# Patient Record
Sex: Male | Born: 1965 | Race: White | Hispanic: No | Marital: Married | Smoking: Never smoker
Health system: Southern US, Community
[De-identification: ages and names within clinical notes are randomized; demographics above are authoritative.]

## PROBLEM LIST (undated history)

## (undated) DIAGNOSIS — E7800 Pure hypercholesterolemia, unspecified: Secondary | ICD-10-CM

## (undated) DIAGNOSIS — K219 Gastro-esophageal reflux disease without esophagitis: Secondary | ICD-10-CM

## (undated) DIAGNOSIS — I10 Essential (primary) hypertension: Secondary | ICD-10-CM

## (undated) DIAGNOSIS — E119 Type 2 diabetes mellitus without complications: Secondary | ICD-10-CM

---

## 2005-05-07 ENCOUNTER — Emergency Department: Payer: Self-pay | Admitting: Emergency Medicine

## 2005-08-26 ENCOUNTER — Ambulatory Visit: Payer: Self-pay | Admitting: Unknown Physician Specialty

## 2006-04-07 ENCOUNTER — Emergency Department: Payer: Self-pay | Admitting: Unknown Physician Specialty

## 2016-06-25 ENCOUNTER — Encounter: Payer: Self-pay | Admitting: *Deleted

## 2016-06-26 ENCOUNTER — Encounter: Payer: Self-pay | Admitting: *Deleted

## 2016-06-26 ENCOUNTER — Ambulatory Visit
Admission: RE | Admit: 2016-06-26 | Discharge: 2016-06-26 | Disposition: A | Discharge status: Home / Self Care | Payer: Managed Care, Other (non HMO) | Source: Ambulatory Visit | Attending: Unknown Physician Specialty | Admitting: Unknown Physician Specialty

## 2016-06-26 ENCOUNTER — Ambulatory Visit: Payer: Managed Care, Other (non HMO) | Admitting: Certified Registered Nurse Anesthetist

## 2016-06-26 ENCOUNTER — Encounter
Admission: RE | Disposition: A | Discharge status: Home / Self Care | Payer: Self-pay | Source: Ambulatory Visit | Attending: Unknown Physician Specialty

## 2016-06-26 DIAGNOSIS — D3610 Benign neoplasm of peripheral nerves and autonomic nervous system, unspecified: Secondary | ICD-10-CM | POA: Diagnosis not present

## 2016-06-26 DIAGNOSIS — Z79899 Other long term (current) drug therapy: Secondary | ICD-10-CM | POA: Diagnosis not present

## 2016-06-26 DIAGNOSIS — K5730 Diverticulosis of large intestine without perforation or abscess without bleeding: Secondary | ICD-10-CM | POA: Insufficient documentation

## 2016-06-26 DIAGNOSIS — Z7984 Long term (current) use of oral hypoglycemic drugs: Secondary | ICD-10-CM | POA: Diagnosis not present

## 2016-06-26 DIAGNOSIS — K219 Gastro-esophageal reflux disease without esophagitis: Secondary | ICD-10-CM | POA: Diagnosis not present

## 2016-06-26 DIAGNOSIS — I10 Essential (primary) hypertension: Secondary | ICD-10-CM | POA: Insufficient documentation

## 2016-06-26 DIAGNOSIS — D120 Benign neoplasm of cecum: Secondary | ICD-10-CM | POA: Diagnosis not present

## 2016-06-26 DIAGNOSIS — K921 Melena: Secondary | ICD-10-CM | POA: Diagnosis present

## 2016-06-26 DIAGNOSIS — K640 First degree hemorrhoids: Secondary | ICD-10-CM | POA: Insufficient documentation

## 2016-06-26 DIAGNOSIS — Z7982 Long term (current) use of aspirin: Secondary | ICD-10-CM | POA: Diagnosis not present

## 2016-06-26 DIAGNOSIS — D124 Benign neoplasm of descending colon: Secondary | ICD-10-CM | POA: Diagnosis not present

## 2016-06-26 DIAGNOSIS — E119 Type 2 diabetes mellitus without complications: Secondary | ICD-10-CM | POA: Insufficient documentation

## 2016-06-26 HISTORY — PX: COLONOSCOPY WITH PROPOFOL: SHX5780

## 2016-06-26 HISTORY — DX: Essential (primary) hypertension: I10

## 2016-06-26 HISTORY — DX: Pure hypercholesterolemia, unspecified: E78.00

## 2016-06-26 HISTORY — DX: Type 2 diabetes mellitus without complications: E11.9

## 2016-06-26 HISTORY — DX: Gastro-esophageal reflux disease without esophagitis: K21.9

## 2016-06-26 LAB — GLUCOSE, CAPILLARY: Glucose-Capillary: 169 mg/dL — ABNORMAL HIGH (ref 65–99)

## 2016-06-26 SURGERY — COLONOSCOPY WITH PROPOFOL
Anesthesia: General

## 2016-06-26 MED ORDER — MIDAZOLAM HCL 2 MG/2ML IJ SOLN
INTRAMUSCULAR | Status: DC | PRN
  Administered 2016-06-26: 1 mg via INTRAVENOUS

## 2016-06-26 MED ORDER — PROPOFOL 500 MG/50ML IV EMUL
INTRAVENOUS | Status: DC | PRN
  Administered 2016-06-26: 140 ug/kg/min via INTRAVENOUS

## 2016-06-26 MED ORDER — SODIUM CHLORIDE 0.9 % IV SOLN: INTRAVENOUS | Status: DC

## 2016-06-26 MED ORDER — SODIUM CHLORIDE 0.9 % IV SOLN
INTRAVENOUS | Status: DC
  Administered 2016-06-26: 08:00:00 via INTRAVENOUS

## 2016-06-26 MED ORDER — PROPOFOL 10 MG/ML IV BOLUS
INTRAVENOUS | Status: DC | PRN
  Administered 2016-06-26: 10 mg via INTRAVENOUS
  Administered 2016-06-26: 30 mg via INTRAVENOUS

## 2016-06-26 NOTE — Anesthesia Procedure Notes (Signed)
Date/Time: 06/26/2016 8:12 AM Performed by: Johnna Acosta Pre-anesthesia Checklist: Patient identified, Emergency Drugs available, Suction available, Patient being monitored and Timeout performed Patient Re-evaluated:Patient Re-evaluated prior to inductionOxygen Delivery Method: Nasal cannula

## 2016-06-26 NOTE — Op Note (Signed)
[facility] Gastroenterology Patient Name: Johnny Barrera Procedure Date: 06/26/2016 8:12 AM MRN: [id number] Account #: [id number] Date of Birth: 1966/01/06 Admit Type: Outpatient Age: 50 Room: [facility] ENDO ROOM 4 Gender: Male Note Status: Finalized Procedure:            Colonoscopy Indications:          Heme positive stool Providers:            Manya Silvas, MD Referring MD:         Hewitt Blade. Sarina Ser, MD (Referring MD) Medicines:            Propofol per Anesthesia Complications:        No immediate complications. Procedure:            Pre-Anesthesia Assessment:                       - After reviewing the risks and benefits, the patient                        was deemed in satisfactory condition to undergo the                        procedure.                       After obtaining informed consent, the colonoscope was                        passed under direct vision. Throughout the procedure,                        the patient's blood pressure, pulse, and oxygen                        saturations were monitored continuously. The                        Colonoscope was introduced through the anus and                        advanced to the the cecum, identified by appendiceal                        orifice and ileocecal valve. The colonoscopy was                        performed without difficulty. The patient tolerated the                        procedure well. The quality of the bowel preparation                        was good. Findings:      A diminutive polyp was found in the cecum. The polyp was sessile. The       polyp was removed with a jumbo cold forceps. Resection and retrieval       were complete.      A small polyp was found in the mid descending colon. The polyp was       sessile. The polyp was removed with a hot snare. Resection and retrieval  were complete.      Many small-mouthed diverticula were found in the sigmoid colon and   descending colon.      Internal hemorrhoids were found during endoscopy. The hemorrhoids were       small and Grade I (internal hemorrhoids that do not prolapse).      The exam was otherwise without abnormality. Impression:           - One diminutive polyp in the cecum, removed with a                        jumbo cold forceps. Resected and retrieved.                       - One small polyp in the mid descending colon, removed                        with a hot snare. Resected and retrieved.                       - Diverticulosis in the sigmoid colon and in the                        descending colon.                       - Internal hemorrhoids.                       - The examination was otherwise normal. Recommendation:       - Await pathology results. Manya Silvas, MD 06/26/2016 8:40:57 AM This report has been signed electronically. Number of Addenda: 0 Note Initiated On: 06/26/2016 8:12 AM Scope Withdrawal Time: 0 hours 14 minutes 7 seconds  Total Procedure Duration: 0 hours 19 minutes 7 seconds       [facility]

## 2016-06-26 NOTE — Anesthesia Postprocedure Evaluation (Signed)
Anesthesia Post Note  Patient: Lashon Dobias.  Procedure(s) Performed: Procedure(s) (LRB): COLONOSCOPY WITH PROPOFOL (N/A)  Patient location during evaluation: Endoscopy Anesthesia Type: General Level of consciousness: awake and alert and oriented Pain management: pain level controlled Vital Signs Assessment: post-procedure vital signs reviewed and stable Respiratory status: spontaneous breathing, nonlabored ventilation and respiratory function stable Cardiovascular status: blood pressure returned to baseline and stable Postop Assessment: no signs of nausea or vomiting Anesthetic complications: no    Last Vitals:  Vitals:   06/26/16 0850 06/26/16 0900  BP: 100/77 112/74  Pulse:    Resp:    Temp:      Last Pain:  Vitals:   06/26/16 0842  TempSrc: Tympanic                 Wilbern Pennypacker

## 2016-06-26 NOTE — Transfer of Care (Signed)
Immediate Anesthesia Transfer of Care Note  Patient: Johnny Barrera.  Procedure(s) Performed: Procedure(s): COLONOSCOPY WITH PROPOFOL (N/A)  Patient Location: PACU  Anesthesia Type:General  Level of Consciousness: sedated  Airway & Oxygen Therapy: Patient Spontanous Breathing and Patient connected to nasal cannula oxygen  Post-op Assessment: Report given to RN and Post -op Vital signs reviewed and stable  Post vital signs: Reviewed and stable  Last Vitals:  Vitals:   06/26/16 0735 06/26/16 0842  BP: 112/75 (!) 88/53  Pulse: 94 99  Resp: 14 14  Temp: 36.8 C 36.6 C    Last Pain:  Vitals:   06/26/16 0842  TempSrc: Tympanic         Complications: No apparent anesthesia complications

## 2016-06-26 NOTE — H&P (Signed)
   Primary Care Physician:  Madelyn Brunner, MD Primary Gastroenterologist:  Dr. Vira Agar  Pre-Procedure History & Physical: HPI:  Johnny Barrera. is a 50 y.o. male is here for an colonoscopy.   Past Medical History:  Diagnosis Date  . Diabetes mellitus without complication ([place])   . GERD (gastroesophageal reflux disease)   . Hypercholesterolemia   . Hypertension     History reviewed. No pertinent surgical history.  Prior to Admission medications   Medication Sig Start Date End Date Taking? Authorizing Provider  aspirin EC 81 MG tablet Take 81 mg by mouth daily.   Yes Historical Provider, MD  lisinopril (PRINIVIL,ZESTRIL) 40 MG tablet Take 40 mg by mouth daily.   Yes Historical Provider, MD  metFORMIN (GLUMETZA) 1000 MG (MOD) 24 hr tablet Take 1,000 mg by mouth daily with breakfast.   Yes Historical Provider, MD  METOPROLOL TARTRATE PO Take 50 mg by mouth daily.   Yes Historical Provider, MD  simvastatin (ZOCOR) 40 MG tablet Take 40 mg by mouth daily.   Yes Historical Provider, MD    Allergies as of 05/04/2016  . (Not on File)    History reviewed. No pertinent family history.  Social History   Social History  . Marital status: Married    Spouse name: N/A  . Number of children: N/A  . Years of education: N/A   Occupational History  . Not on file.   Social History Main Topics  . Smoking status: Never Smoker  . Smokeless tobacco: Never Used  . Alcohol use Not on file  . Drug use: Unknown  . Sexual activity: Not on file   Other Topics Concern  . Not on file   Social History Narrative  . No narrative on file    Review of Systems: See HPI, otherwise negative ROS  Physical Exam: BP 112/75   Pulse 94   Temp 98.3 F (36.8 C) (Tympanic)   Resp 14   Ht 5\' 9"  (1.753 m)   Wt 111.1 kg (245 lb)   SpO2 100%   BMI 36.18 kg/m  General:   Alert,  pleasant and cooperative in NAD Head:  Normocephalic and atraumatic. Neck:  Supple; no masses or  thyromegaly. Lungs:  Clear throughout to auscultation.    Heart:  Regular rate and rhythm. Abdomen:  Soft, nontender and nondistended. Normal bowel sounds, without guarding, and without rebound.   Neurologic:  Alert and  oriented x4;  grossly normal neurologically.  Impression/Plan: Johnny Barrera. is here for an colonoscopy to be performed for heme positive stool  Risks, benefits, limitations, and alternatives regarding  colonoscopy have been reviewed with the patient.  Questions have been answered.  All parties agreeable.   Gaylyn Cheers, MD  06/26/2016, 8:11 AM

## 2016-06-26 NOTE — Anesthesia Preprocedure Evaluation (Signed)
Anesthesia Evaluation  Patient identified by MRN, date of birth, ID band Patient awake    Reviewed: Allergy & Precautions, NPO status , Patient's Chart, lab work & pertinent test results, reviewed documented beta blocker date and time   History of Anesthesia Complications Negative for: history of anesthetic complications  Airway Mallampati: II  TM Distance: >3 FB Neck ROM: Full    Dental no notable dental hx.    Pulmonary neg pulmonary ROS, neg sleep apnea, neg COPD,    breath sounds clear to auscultation- rhonchi (-) wheezing      Cardiovascular Exercise Tolerance: Good hypertension, Pt. on medications and Pt. on home beta blockers (-) CAD and (-) Past MI  Rhythm:Regular Rate:Normal - Systolic murmurs and - Diastolic murmurs    Neuro/Psych negative neurological ROS  negative psych ROS   GI/Hepatic Neg liver ROS, GERD  ,  Endo/Other  diabetes, Type 2, Oral Hypoglycemic Agents  Renal/GU negative Renal ROS     Musculoskeletal negative musculoskeletal ROS (+)   Abdominal (+) + obese,   Peds  Hematology negative hematology ROS (+)   Anesthesia Other Findings Past Medical History: No date: Diabetes mellitus without complication (HCC) No date: GERD (gastroesophageal reflux disease) No date: Hypercholesterolemia No date: Hypertension   Reproductive/Obstetrics                             Anesthesia Physical Anesthesia Plan  ASA: II  Anesthesia Plan: General   Post-op Pain Management:    Induction: Intravenous  Airway Management Planned: Natural Airway  Additional Equipment:   Intra-op Plan:   Post-operative Plan:   Informed Consent: I have reviewed the patients History and Physical, chart, labs and discussed the procedure including the risks, benefits and alternatives for the proposed anesthesia with the patient or authorized representative who has indicated his/her understanding  and acceptance.   Dental advisory given  Plan Discussed with: Anesthesiologist and CRNA  Anesthesia Plan Comments:         Anesthesia Quick Evaluation

## 2016-06-30 ENCOUNTER — Encounter: Payer: Self-pay | Admitting: Unknown Physician Specialty

## 2016-06-30 LAB — SURGICAL PATHOLOGY

## 2017-10-08 ENCOUNTER — Inpatient Hospital Stay: Admission: RE | Admit: 2017-10-08 | Payer: Managed Care, Other (non HMO) | Source: Ambulatory Visit

## 2017-10-15 ENCOUNTER — Ambulatory Visit: Admit: 2017-10-15 | Payer: Managed Care, Other (non HMO) | Admitting: Surgery

## 2017-10-15 SURGERY — REPAIR, HERNIA, UMBILICAL, ADULT
Anesthesia: Choice

## 2018-07-05 ENCOUNTER — Other Ambulatory Visit: Payer: Self-pay | Admitting: Internal Medicine

## 2018-07-05 DIAGNOSIS — N179 Acute kidney failure, unspecified: Secondary | ICD-10-CM

## 2018-07-11 ENCOUNTER — Ambulatory Visit
Admission: RE | Admit: 2018-07-11 | Discharge: 2018-07-11 | Disposition: A | Discharge status: Home / Self Care | Payer: Managed Care, Other (non HMO) | Source: Ambulatory Visit | Attending: Internal Medicine | Admitting: Internal Medicine

## 2018-07-11 DIAGNOSIS — N179 Acute kidney failure, unspecified: Secondary | ICD-10-CM | POA: Diagnosis not present

## 2018-10-21 ENCOUNTER — Encounter: Payer: Self-pay | Admitting: Emergency Medicine

## 2018-10-21 ENCOUNTER — Emergency Department: Payer: Managed Care, Other (non HMO)

## 2018-10-21 ENCOUNTER — Inpatient Hospital Stay
Admission: EM | Admit: 2018-10-21 | Discharge: 2018-10-24 | DRG: 871 | Disposition: A | Discharge status: Home / Self Care | Payer: Managed Care, Other (non HMO) | Attending: Internal Medicine | Admitting: Internal Medicine

## 2018-10-21 ENCOUNTER — Other Ambulatory Visit: Payer: Self-pay

## 2018-10-21 DIAGNOSIS — R197 Diarrhea, unspecified: Secondary | ICD-10-CM

## 2018-10-21 DIAGNOSIS — E7800 Pure hypercholesterolemia, unspecified: Secondary | ICD-10-CM | POA: Diagnosis present

## 2018-10-21 DIAGNOSIS — E872 Acidosis: Secondary | ICD-10-CM | POA: Diagnosis present

## 2018-10-21 DIAGNOSIS — I10 Essential (primary) hypertension: Secondary | ICD-10-CM | POA: Diagnosis present

## 2018-10-21 DIAGNOSIS — R6521 Severe sepsis with septic shock: Secondary | ICD-10-CM | POA: Diagnosis present

## 2018-10-21 DIAGNOSIS — K529 Noninfective gastroenteritis and colitis, unspecified: Secondary | ICD-10-CM | POA: Diagnosis not present

## 2018-10-21 DIAGNOSIS — K219 Gastro-esophageal reflux disease without esophagitis: Secondary | ICD-10-CM | POA: Diagnosis present

## 2018-10-21 DIAGNOSIS — R112 Nausea with vomiting, unspecified: Secondary | ICD-10-CM

## 2018-10-21 DIAGNOSIS — E119 Type 2 diabetes mellitus without complications: Secondary | ICD-10-CM | POA: Diagnosis present

## 2018-10-21 DIAGNOSIS — I959 Hypotension, unspecified: Secondary | ICD-10-CM | POA: Diagnosis present

## 2018-10-21 DIAGNOSIS — K5730 Diverticulosis of large intestine without perforation or abscess without bleeding: Secondary | ICD-10-CM | POA: Diagnosis present

## 2018-10-21 DIAGNOSIS — Z7982 Long term (current) use of aspirin: Secondary | ICD-10-CM

## 2018-10-21 DIAGNOSIS — Z79899 Other long term (current) drug therapy: Secondary | ICD-10-CM

## 2018-10-21 DIAGNOSIS — E860 Dehydration: Secondary | ICD-10-CM | POA: Diagnosis present

## 2018-10-21 DIAGNOSIS — E876 Hypokalemia: Secondary | ICD-10-CM | POA: Diagnosis not present

## 2018-10-21 DIAGNOSIS — A0811 Acute gastroenteropathy due to Norwalk agent: Secondary | ICD-10-CM | POA: Diagnosis present

## 2018-10-21 DIAGNOSIS — K760 Fatty (change of) liver, not elsewhere classified: Secondary | ICD-10-CM | POA: Diagnosis present

## 2018-10-21 DIAGNOSIS — Z7984 Long term (current) use of oral hypoglycemic drugs: Secondary | ICD-10-CM

## 2018-10-21 DIAGNOSIS — A4189 Other specified sepsis: Principal | ICD-10-CM | POA: Diagnosis present

## 2018-10-21 DIAGNOSIS — A419 Sepsis, unspecified organism: Secondary | ICD-10-CM

## 2018-10-21 DIAGNOSIS — R6511 Systemic inflammatory response syndrome (SIRS) of non-infectious origin with acute organ dysfunction: Secondary | ICD-10-CM | POA: Diagnosis not present

## 2018-10-21 DIAGNOSIS — N170 Acute kidney failure with tubular necrosis: Secondary | ICD-10-CM | POA: Diagnosis present

## 2018-10-21 LAB — GLUCOSE, CAPILLARY
GLUCOSE-CAPILLARY: 222 mg/dL — AB (ref 70–99)
Glucose-Capillary: 178 mg/dL — ABNORMAL HIGH (ref 70–99)

## 2018-10-21 LAB — PROCALCITONIN: Procalcitonin: 5.23 ng/mL

## 2018-10-21 LAB — COMPREHENSIVE METABOLIC PANEL
ALK PHOS: 55 U/L (ref 38–126)
ALT: 70 U/L — ABNORMAL HIGH (ref 0–44)
ANION GAP: 13 (ref 5–15)
AST: 37 U/L (ref 15–41)
Albumin: 4.4 g/dL (ref 3.5–5.0)
BUN: 26 mg/dL — ABNORMAL HIGH (ref 6–20)
CO2: 28 mmol/L (ref 22–32)
Calcium: 9.5 mg/dL (ref 8.9–10.3)
Chloride: 99 mmol/L (ref 98–111)
Creatinine, Ser: 1.42 mg/dL — ABNORMAL HIGH (ref 0.61–1.24)
GFR calc non Af Amer: 57 mL/min — ABNORMAL LOW (ref >60)
Glucose, Bld: 199 mg/dL — ABNORMAL HIGH (ref 70–99)
POTASSIUM: 4.1 mmol/L (ref 3.5–5.1)
SODIUM: 140 mmol/L (ref 135–145)
Total Bilirubin: 0.8 mg/dL (ref 0.3–1.2)
Total Protein: 7.2 g/dL (ref 6.5–8.1)

## 2018-10-21 LAB — CG4 I-STAT (LACTIC ACID): Lactic Acid, Venous: 3.46 mmol/L (ref 0.5–1.9)

## 2018-10-21 LAB — CBC
HCT: 46.4 % (ref 39.0–52.0)
HEMOGLOBIN: 15.5 g/dL (ref 13.0–17.0)
MCH: 29.2 pg (ref 26.0–34.0)
MCHC: 33.4 g/dL (ref 30.0–36.0)
MCV: 87.4 fL (ref 80.0–100.0)
PLATELETS: 262 10*3/uL (ref 150–400)
RBC: 5.31 MIL/uL (ref 4.22–5.81)
RDW: 12.3 % (ref 11.5–15.5)
WBC: 10.1 10*3/uL (ref 4.0–10.5)
nRBC: 0 % (ref 0.0–0.2)

## 2018-10-21 LAB — TROPONIN I: Troponin I: <0.03 ng/mL (ref <0.03)

## 2018-10-21 LAB — MRSA PCR SCREENING: MRSA by PCR: NEGATIVE

## 2018-10-21 LAB — LIPASE, BLOOD: Lipase: 48 U/L (ref 11–51)

## 2018-10-21 LAB — INFLUENZA PANEL BY PCR (TYPE A & B)
INFLAPCR: NEGATIVE
INFLBPCR: NEGATIVE

## 2018-10-21 MED ORDER — SODIUM CHLORIDE 0.9 % IV SOLN
2.0000 g | Freq: Once | INTRAVENOUS | Status: AC
  Administered 2018-10-21: 2 g via INTRAVENOUS
  Filled 2018-10-21: qty 2

## 2018-10-21 MED ORDER — SODIUM CHLORIDE 0.9 % IV BOLUS
1000.0000 mL | Freq: Once | INTRAVENOUS | Status: AC
Start: 2018-10-21 — End: 2018-10-21
  Administered 2018-10-21: 1000 mL via INTRAVENOUS

## 2018-10-21 MED ORDER — ACETAMINOPHEN 325 MG PO TABS
650.0000 mg | ORAL_TABLET | Freq: Once | ORAL | Status: AC
  Administered 2018-10-21: 650 mg via ORAL

## 2018-10-21 MED ORDER — SODIUM CHLORIDE 0.9 % IV BOLUS
1000.0000 mL | Freq: Once | INTRAVENOUS | Status: AC
  Administered 2018-10-21: 1000 mL via INTRAVENOUS

## 2018-10-21 MED ORDER — SENNOSIDES-DOCUSATE SODIUM 8.6-50 MG PO TABS: 1.0000 | ORAL_TABLET | Freq: Every evening | ORAL | Status: DC | PRN

## 2018-10-21 MED ORDER — SODIUM CHLORIDE 0.9 % IV SOLN
2.0000 g | Freq: Two times a day (BID) | INTRAVENOUS | Status: DC
  Administered 2018-10-21: 2 g via INTRAVENOUS
  Filled 2018-10-21 (×3): qty 2

## 2018-10-21 MED ORDER — ACETAMINOPHEN 500 MG PO TABS
1000.0000 mg | ORAL_TABLET | Freq: Once | ORAL | Status: AC
  Administered 2018-10-21: 1000 mg via ORAL
  Filled 2018-10-21: qty 2

## 2018-10-21 MED ORDER — VANCOMYCIN HCL 10 G IV SOLR
1250.0000 mg | Freq: Two times a day (BID) | INTRAVENOUS | Status: DC
  Administered 2018-10-21 – 2018-10-22 (×2): 1250 mg via INTRAVENOUS
  Filled 2018-10-21 (×3): qty 1250

## 2018-10-21 MED ORDER — ASPIRIN EC 81 MG PO TBEC
81.0000 mg | DELAYED_RELEASE_TABLET | Freq: Every day | ORAL | Status: DC
  Administered 2018-10-21 – 2018-10-24 (×4): 81 mg via ORAL
  Filled 2018-10-21 (×4): qty 1

## 2018-10-21 MED ORDER — VANCOMYCIN HCL IN DEXTROSE 1-5 GM/200ML-% IV SOLN
1000.0000 mg | Freq: Once | INTRAVENOUS | Status: AC
  Administered 2018-10-21: 1000 mg via INTRAVENOUS
  Filled 2018-10-21: qty 200

## 2018-10-21 MED ORDER — SODIUM CHLORIDE 0.9 % IV SOLN
INTRAVENOUS | Status: DC | PRN
  Administered 2018-10-21: 250 mL via INTRAVENOUS

## 2018-10-21 MED ORDER — LORAZEPAM 2 MG/ML IJ SOLN
0.5000 mg | Freq: Once | INTRAMUSCULAR | Status: AC
  Administered 2018-10-21: 0.5 mg via INTRAVENOUS
  Filled 2018-10-21: qty 1

## 2018-10-21 MED ORDER — ACETAMINOPHEN 650 MG RE SUPP: 650.0000 mg | Freq: Four times a day (QID) | RECTAL | Status: DC | PRN

## 2018-10-21 MED ORDER — OXYMETAZOLINE HCL 0.05 % NA SOLN
1.0000 | Freq: Once | NASAL | Status: AC
  Administered 2018-10-21: 1 via NASAL
  Filled 2018-10-21: qty 15

## 2018-10-21 MED ORDER — NOREPINEPHRINE BITARTRATE 1 MG/ML IV SOLN
0.0000 ug/min | INTRAVENOUS | Status: DC
  Administered 2018-10-21: 5 ug/min via INTRAVENOUS
  Filled 2018-10-21 (×2): qty 4

## 2018-10-21 MED ORDER — ENOXAPARIN SODIUM 40 MG/0.4ML ~~LOC~~ SOLN
40.0000 mg | SUBCUTANEOUS | Status: DC
  Administered 2018-10-21 – 2018-10-23 (×3): 40 mg via SUBCUTANEOUS
  Filled 2018-10-21 (×3): qty 0.4

## 2018-10-21 MED ORDER — FENTANYL CITRATE (PF) 100 MCG/2ML IJ SOLN
50.0000 ug | Freq: Once | INTRAMUSCULAR | Status: AC
  Administered 2018-10-21: 50 ug via INTRAVENOUS
  Filled 2018-10-21: qty 2

## 2018-10-21 MED ORDER — MELATONIN 5 MG PO TABS
5.0000 mg | ORAL_TABLET | Freq: Every day | ORAL | Status: DC
  Administered 2018-10-21 – 2018-10-22 (×2): 5 mg via ORAL
  Filled 2018-10-21 (×4): qty 1

## 2018-10-21 MED ORDER — SIMVASTATIN 40 MG PO TABS
40.0000 mg | ORAL_TABLET | Freq: Every evening | ORAL | Status: DC
  Administered 2018-10-21 – 2018-10-23 (×3): 40 mg via ORAL
  Filled 2018-10-21: qty 1
  Filled 2018-10-21: qty 2
  Filled 2018-10-21: qty 1
  Filled 2018-10-21: qty 2

## 2018-10-21 MED ORDER — INSULIN ASPART 100 UNIT/ML ~~LOC~~ SOLN
0.0000 [IU] | SUBCUTANEOUS | Status: DC
  Administered 2018-10-21: 3 [IU] via SUBCUTANEOUS
  Administered 2018-10-22: 2 [IU] via SUBCUTANEOUS
  Administered 2018-10-22: 3 [IU] via SUBCUTANEOUS
  Administered 2018-10-22: 5 [IU] via SUBCUTANEOUS
  Administered 2018-10-22: 3 [IU] via SUBCUTANEOUS
  Administered 2018-10-22: 2 [IU] via SUBCUTANEOUS
  Administered 2018-10-23 (×4): 3 [IU] via SUBCUTANEOUS
  Filled 2018-10-21 (×10): qty 1

## 2018-10-21 MED ORDER — ONDANSETRON HCL 4 MG PO TABS
4.0000 mg | ORAL_TABLET | Freq: Four times a day (QID) | ORAL | Status: DC | PRN
  Administered 2018-10-23: 4 mg via ORAL
  Filled 2018-10-21: qty 1

## 2018-10-21 MED ORDER — ONDANSETRON HCL 4 MG/2ML IJ SOLN
4.0000 mg | Freq: Four times a day (QID) | INTRAMUSCULAR | Status: DC | PRN
  Administered 2018-10-21: 4 mg via INTRAVENOUS
  Filled 2018-10-21: qty 2

## 2018-10-21 MED ORDER — PROMETHAZINE HCL 25 MG/ML IJ SOLN
25.0000 mg | Freq: Once | INTRAMUSCULAR | Status: AC
  Administered 2018-10-21: 25 mg via INTRAVENOUS
  Filled 2018-10-21: qty 1

## 2018-10-21 MED ORDER — SODIUM CHLORIDE 0.9 % IV SOLN
INTRAVENOUS | Status: DC
  Administered 2018-10-21 – 2018-10-22 (×3): via INTRAVENOUS

## 2018-10-21 MED ORDER — METRONIDAZOLE IN NACL 5-0.79 MG/ML-% IV SOLN
500.0000 mg | Freq: Three times a day (TID) | INTRAVENOUS | Status: DC
  Administered 2018-10-21 – 2018-10-22 (×3): 500 mg via INTRAVENOUS
  Filled 2018-10-21 (×5): qty 100

## 2018-10-21 MED ORDER — KETOROLAC TROMETHAMINE 30 MG/ML IJ SOLN
15.0000 mg | Freq: Once | INTRAMUSCULAR | Status: AC
  Administered 2018-10-21: 15 mg via INTRAVENOUS
  Filled 2018-10-21: qty 1

## 2018-10-21 MED ORDER — ACETAMINOPHEN 325 MG PO TABS
650.0000 mg | ORAL_TABLET | Freq: Four times a day (QID) | ORAL | Status: DC | PRN
  Administered 2018-10-21 – 2018-10-24 (×5): 650 mg via ORAL
  Filled 2018-10-21 (×7): qty 2

## 2018-10-21 NOTE — Progress Notes (Signed)
Advanced care plan.  Purpose of the Encounter: CODE STATUS  Parties in Attendance: Patient  Patient's Decision Capacity: Good  Subjective/Patient's story: Presented to emergency room for fever and weakness   Objective/Medical story Has low blood pressure has fever and tachycardia Patient has sepsis Needs IV fluids and broad-spectrum antibiotics and work-up   Goals of care determination:  Advance care directives goals of care and treatment plan discussed Patient wants everything done which includes CPR, intubation ventilator if the need arises  CODE STATUS: Full code   Time spent discussing advanced care planning: 16 minutes

## 2018-10-21 NOTE — H&P (Addendum)
[facility] [place] NAME: Johnny Barrera    MR#:  [id number]  DATE OF BIRTH:  [birth year]  DATE OF ADMISSION:  10/21/2018  PRIMARY CARE PHYSICIAN: Baxter Hire, MD   REQUESTING/REFERRING PHYSICIAN:   CHIEF COMPLAINT:   Chief Complaint  Patient presents with  . Emesis  . Diarrhea    HISTORY OF PRESENT ILLNESS: Johnny Barrera  is a 52 y.o. male with a known history of diabetes mellitus type 2, GERD, hyperlipidemia, hypertension presented to the emergency room for fever and generalized weakness.  Patient also had some nausea and vomiting and diarrhea.  Diarrhea is completely resolved after coming to the emergency room.  He had generalized weakness and was not feeling well for the last couple of days.  Had fever this morning.  Evaluation in the emergency room patient was hypotensive and tachycardic.  Code sepsis was called patient received IV fluid boluses and started on broad-spectrum IV antibiotics.  He received 3 L of IV fluids in the emergency room.  Blood pressure improved.  Blood pressure medications for held.  Hospitalist service was consulted for further care.  Urine analysis is pending. patient also complained of headache.  He was worked up with CT head which showed no acute abnormality.  Chest x-ray revealed no pneumonia  PAST MEDICAL HISTORY:   Past Medical History:  Diagnosis Date  . Diabetes mellitus without complication ([place])   . GERD (gastroesophageal reflux disease)   . Hypercholesterolemia   . Hypertension     PAST SURGICAL HISTORY:  Past Surgical History:  Procedure Laterality Date  . COLONOSCOPY WITH PROPOFOL N/A 06/26/2016   Procedure: COLONOSCOPY WITH PROPOFOL;  Surgeon: Manya Silvas, MD;  Location: [facility] ENDOSCOPY;  Service: Endoscopy;  Laterality: N/A;    SOCIAL HISTORY:  Social History   Tobacco Use  . Smoking status: Never Smoker  . Smokeless tobacco: Never Used  Substance Use Topics  . Alcohol use:  Not Currently    FAMILY HISTORY: History reviewed. No pertinent family history.  DRUG ALLERGIES: No Known Allergies  REVIEW OF SYSTEMS:   CONSTITUTIONAL: Has fever, fatigue and weakness.  EYES: No blurred or double vision.  EARS, NOSE, AND THROAT: No tinnitus or ear pain.  RESPIRATORY: No cough, shortness of breath, wheezing or hemoptysis.  CARDIOVASCULAR: No chest pain, orthopnea, edema.  GASTROINTESTINAL: Had nausea, vomiting, diarrhea  no abdominal pain.  GENITOURINARY: No dysuria, hematuria.  ENDOCRINE: No polyuria, nocturia,  HEMATOLOGY: No anemia, easy bruising or bleeding SKIN: No rash or lesion. MUSCULOSKELETAL: No joint pain or arthritis.   NEUROLOGIC: No tingling, numbness, weakness.  PSYCHIATRY: No anxiety or depression.   MEDICATIONS AT HOME:  Prior to Admission medications   Medication Sig Start Date End Date Taking? Authorizing Provider  aspirin EC 81 MG tablet Take 81 mg by mouth daily.    [provider]  lisinopril (PRINIVIL,ZESTRIL) 40 MG tablet Take 40 mg by mouth daily.    [provider]  metFORMIN (GLUCOPHAGE) 1000 MG tablet Take 1,000 mg by mouth 2 (two) times daily. 06/25/17   [provider]  metoprolol tartrate (LOPRESSOR) 50 MG tablet Take 50 mg by mouth 2 (two) times daily.    [provider]  Multiple Vitamins-Minerals (MULTIVITAMIN PO) Take 1 tablet by mouth daily.    [provider]  naproxen sodium (ALEVE) 220 MG tablet Take 220 mg by mouth 2 (two) times daily as needed (for pain or headache).    [provider]  simvastatin (ZOCOR) 40 MG tablet Take 40 mg by mouth every evening.     [provider]      PHYSICAL EXAMINATION:   VITAL SIGNS: Blood pressure (!) 116/91, pulse (!) 145, temperature (!) 101.4 F (38.6 C), resp. rate (!) 38, height 5\' 9"  (1.753 m), weight 109.8 kg, SpO2 98 %.  GENERAL:  52 y.o.-year-old patient lying in the bed with no acute distress.  EYES: Pupils equal,  round, reactive to light and accommodation. No scleral icterus. Extraocular muscles intact.  HEENT: Head atraumatic, normocephalic. Oropharynx dry and nasopharynx clear.  NECK:  Supple, no jugular venous distention. No thyroid enlargement, no tenderness.  LUNGS: Normal breath sounds bilaterally, no wheezing, rales,rhonchi or crepitation. No use of accessory muscles of respiration.  CARDIOVASCULAR: S1, S2 tachycardia noted. No murmurs, rubs, or gallops.  ABDOMEN: Soft, nontender, nondistended. Bowel sounds present. No organomegaly or mass.  EXTREMITIES: No pedal edema, cyanosis, or clubbing.  NEUROLOGIC: Cranial nerves II through XII are intact. Muscle strength 5/5 in all extremities. Sensation intact. Gait not checked.  PSYCHIATRIC: The patient is alert and oriented x 3.  SKIN: No obvious rash, lesion, or ulcer.   LABORATORY PANEL:   CBC Recent Labs  Lab 10/21/18 0754  WBC 10.1  HGB 15.5  HCT 46.4  PLT 262  MCV 87.4  MCH 29.2  MCHC 33.4  RDW 12.3   ------------------------------------------------------------------------------------------------------------------  Chemistries  Recent Labs  Lab 10/21/18 0754  NA 140  K 4.1  CL 99  CO2 28  GLUCOSE 199*  BUN 26*  CREATININE 1.42*  CALCIUM 9.5  AST 37  ALT 70*  ALKPHOS 55  BILITOT 0.8   ------------------------------------------------------------------------------------------------------------------ estimated creatinine clearance is 75.1 mL/min (A) (by C-G formula based on SCr of 1.42 mg/dL (H)). ------------------------------------------------------------------------------------------------------------------ No results for input(s): TSH, T4TOTAL, T3FREE, THYROIDAB in the last 72 hours.  Invalid input(s): FREET3   Coagulation profile No results for input(s): INR, PROTIME in the last 168 hours. ------------------------------------------------------------------------------------------------------------------- No  results for input(s): DDIMER in the last 72 hours. -------------------------------------------------------------------------------------------------------------------  Cardiac Enzymes Recent Labs  Lab 10/21/18 0754  TROPONINI <0.03   ------------------------------------------------------------------------------------------------------------------ Invalid input(s): POCBNP  ---------------------------------------------------------------------------------------------------------------  Urinalysis No results found for: COLORURINE, APPEARANCEUR, LABSPEC, PHURINE, GLUCOSEU, HGBUR, BILIRUBINUR, KETONESUR, PROTEINUR, UROBILINOGEN, NITRITE, LEUKOCYTESUR   RADIOLOGY: Ct Head Wo Contrast  Result Date: 10/21/2018 CLINICAL DATA:  Headache with nausea and vomiting EXAM: CT HEAD WITHOUT CONTRAST TECHNIQUE: Contiguous axial images were obtained from the base of the skull through the vertex without intravenous contrast. COMPARISON:  None. FINDINGS: Brain: The ventricles are normal in size and configuration. There is no intracranial mass, hemorrhage, extra-axial fluid collection, or midline shift. The brain parenchyma appears unremarkable. No acute infarct evident. Vascular: There is no hyperdense vessel. There are foci of calcification in each carotid siphon region. Skull: The bony calvarium appears intact. Sinuses/Orbits: There is mucosal thickening in several ethmoid air cells. Other visualized paranasal sinuses are clear. Orbits appear symmetric bilaterally. Other: Visualized mastoid air cells are clear. IMPRESSION: Brain parenchyma appears unremarkable.  No mass or hemorrhage. Foci of arterial vascular calcification noted. There is mucosal thickening in several ethmoid air cells. Electronically Signed   By: Lowella Grip III M.D.   On: 10/21/2018 08:57   Dg Chest Port 1 View  Result Date: 10/21/2018 CLINICAL DATA:  Nausea, vomiting and diarrhea beginning early this morning. Chills and body aches.  EXAM: PORTABLE CHEST 1 VIEW COMPARISON:  None. FINDINGS: The heart size and mediastinal contours are within normal  limits. Both lungs are clear. No pleural effusion or pneumothorax. The visualized skeletal structures are unremarkable. IMPRESSION: No active disease. Electronically Signed   By: Lajean Manes M.D.   On: 10/21/2018 11:54    EKG: Orders placed or performed during the hospital encounter of 10/21/18  . ED EKG  . ED EKG  . EKG 12-Lead  . EKG 12-Lead    IMPRESSION AND PLAN: 52 year old male patient with history of diabetes mellitus type 2, GERD, hyperlipidemia, hypertension presented to the emergency room for fever, weakness, vomitings  -Sepsis IV fluids aggressively Follow-up cultures of blood Check urinalysis and urine culture Broad-spectrum IV antibiotics that is IV vancomycin and IV cefepime Follow-up lactic acid levels and procalcitonin Admit patient to stepdown unit Intensivist consult  -Dehydration IV fluids  -Nausea and vomiting Antiemetics intravenously  -Diarrhea Stool work-up  -Hypotension Probably secondary to sepsis IV fluids aggressively IV Levophed drip if needed if blood pressure does not respond to IV fluids Hold blood pressure medication  -Type 2 diabetes mellitus Diabetic diet with sliding scale coverage with insulin  -DVT prophylaxis subcu Lovenox daily All the records are reviewed and case discussed with ED provider. Management plans discussed with the patient, family and they are in agreement.  CODE STATUS:Full code    TOTAL TIME TAKING CARE OF THIS PATIENT: 55 minutes.    Saundra Shelling M.D on 10/21/2018 at 12:14 PM  Between 7am to 6pm - Pager - [phone]  After 6pm go to [email or web address] - password [email or web address] [place] [facility]  Office  [phone]  CC: Primary care physician; Baxter Hire, MD

## 2018-10-21 NOTE — ED Notes (Signed)
Called to room by pt who reports headache.  VS improved NS bolus completed at this time.  Dr. Kerman Passey notified of pt c/o.  Will monitor.

## 2018-10-21 NOTE — Progress Notes (Signed)
Pharmacy Antibiotic Note  Johnny Barrera. is a 52 y.o. male admitted on 10/21/2018 with sepsis.  Pharmacy has been consulted for cefepime and vancomycin dosing.  Plan: 1. Cefepime 2 gm IV Q12H 2. Vancomycin 1 gm IV x 1 in ED followed in approximately 6 hours (stacked dosing) by vancomycin 1.25 gm IV Q12H, predicted trough 18 mcg/ml. Pharmacy will continue to follow and adjust as needed to maintain trough 15 to 20 mcg/ml.   Vd 60.5 L, Ke 0.067 hr-1, T1/2 10.4 hr  Height: 5\' 9"  (175.3 cm) Weight: 242 lb (109.8 kg) IBW/kg (Calculated) : 70.7  Temp (24hrs), Avg:100.4 F (38 C), Min:99.4 F (37.4 C), Max:101.4 F (38.6 C)  Recent Labs  Lab 10/21/18 0754 10/21/18 1227  WBC 10.1  --   CREATININE 1.42*  --   LATICACIDVEN  --  3.46*    Estimated Creatinine Clearance: 75.1 mL/min (A) (by C-G formula based on SCr of 1.42 mg/dL (H)).    No Known Allergies  Antimicrobials this admission:   Dose adjustments this admission:   Microbiology results:  BCx:   UCx:    Sputum:    MRSA PCR:   Thank you for allowing pharmacy to be a part of this patient's care.  Laural Benes, PharmD, BCPS Clinical Pharmacist 10/21/2018 12:32 PM

## 2018-10-21 NOTE — Progress Notes (Signed)
CODE SEPSIS - PHARMACY COMMUNICATION  **Broad Spectrum Antibiotics should be administered within 1 hour of Sepsis diagnosis**  Time Code Sepsis Called/Page Received: 1140  Antibiotics Ordered: Cefepime, Metronidazole, Vancomycin   Time of 1st antibiotic administration: 1157 Cefepime   Additional action taken by pharmacy: N/A  If necessary, Name of Provider/Nurse Contacted: none    Quinlynn Cuthbert L ,PharmD Clinical Pharmacist  10/21/2018  12:12 PM

## 2018-10-21 NOTE — ED Triage Notes (Signed)
PT to ER via EMS from home with reports of n/v/d onset around 3:30 this AM.  Pt also reports chills and bodyaches.  Pt reports thirst.  Hx of Type 2 DM, CBG 183 per EMS.

## 2018-10-21 NOTE — ED Notes (Signed)
Called to pt room, states he can not breathe through his nose.  PT becoming very anxious, HR remains elevated, and pt remains hypotensive.  Dr. Kerman Passey in room for reassessment, new orders received.  See MAR for medications.

## 2018-10-21 NOTE — ED Notes (Signed)
Pt resting on ER stretcher, lying on left side, BP cuff on left arm.  Pt is resting quietly at this time.  Discussed VS with Dr. Kerman Passey, additional IV fluids at this time.  Will continue to monitor.

## 2018-10-21 NOTE — ED Notes (Signed)
PT continues with SBP 80s-90s.  Discussed same with admitting MD, will change pt to step down at this time.

## 2018-10-21 NOTE — Progress Notes (Signed)
Verbal order received for Tylenol Po 650 mg Once now for c/o generalized pain (5/10) and no prn pain medication currently due.

## 2018-10-21 NOTE — Consult Note (Addendum)
PULMONARY / CRITICAL CARE MEDICINE  Name: Johnny Barrera. MRN: [id number] DOB: 05-12-1966    LOS: 0  Referring Provider: Dr. Estanislado Pandy Reason for Referral: Septic shock Brief patient description: 52 year old male who presented with nausea vomiting and diarrhea, ruled in for sepsis of GI source and septic shock.  Admitted to the ICU for pressors.  HPI: This is a 52 year old male with a medical history as indicated below who presented to the ED with sudden onset progressive nausea vomiting and diarrhea that started at about 3 AM.  Symptoms progressively got worse hence he decided to come to the emergency room.  Patient states that he ate some hot dogs at a restaurant and then ate his regular dinner at home and went to bed.  At about 3 AM he started having profound nausea and vomiting and diarrhea.  He denies any bloody emesis or bloody stools.  At the ED he was found to be hypotensive, tachycardic, febrile and in acute renal failure.  He was started on IV fluids and pressors and admitted to the ICU. He reports significant improvement in nausea vomiting and diarrhea.  He is complaining of generalized muscle aches and right shoulder pain.  Describes pain as achy, 4 on 10 and interfering with sleep.  He is requesting for something for sleep.  Past Medical History:  Diagnosis Date  . Diabetes mellitus without complication ([place])   . GERD (gastroesophageal reflux disease)   . Hypercholesterolemia   . Hypertension    Past Surgical History:  Procedure Laterality Date  . COLONOSCOPY WITH PROPOFOL N/A 06/26/2016   Procedure: COLONOSCOPY WITH PROPOFOL;  Surgeon: Manya Silvas, MD;  Location: [facility] ENDOSCOPY;  Service: Endoscopy;  Laterality: N/A;   Prior to Admission medications   Medication Sig Start Date End Date Taking? Authorizing Provider  amLODipine (NORVASC) 5 MG tablet Take 5 mg by mouth daily.   Yes [provider]  clopidogrel (PLAVIX) 75 MG tablet Take 75 mg by mouth daily.    Yes [provider]  donepezil (ARICEPT) 5 MG tablet Take 1 tablet (5 mg total) by mouth at bedtime. 06/20/18 07/30/18 Yes Sowles, Drue Stager, MD  empagliflozin (JARDIANCE) 25 MG TABS tablet Take 25 mg by mouth daily.   Yes [provider]  glycopyrrolate (ROBINUL) 1 MG tablet Take 1 mg by mouth 2 (two) times daily.   Yes [provider]  insulin aspart (NOVOLOG FLEXPEN) 100 UNIT/ML FlexPen Inject 12 Units into the skin 2 (two) times daily.   Yes [provider]  insulin aspart (NOVOLOG) 100 UNIT/ML FlexPen Inject 18 Units into the skin daily. At 1700   Yes [provider]  Insulin Degludec-Liraglutide (XULTOPHY) 100-3.6 UNIT-MG/ML SOPN Inject 50 Units into the skin daily.   Yes [provider]  levETIRAcetam (KEPPRA) 500 MG tablet Take 500 mg by mouth 2 (two) times daily.   Yes [provider]  lipase/protease/amylase (CREON) 12000 units CPEP capsule Take 6,000 Units by mouth 3 (three) times daily before meals.   Yes [provider]  lipase/protease/amylase (CREON) 12000 units CPEP capsule Take 3,000 Units by mouth at bedtime. With snack   Yes [provider]  lisinopril (PRINIVIL,ZESTRIL) 5 MG tablet Take 5 mg by mouth daily.   Yes [provider]  metoprolol succinate (TOPROL-XL) 25 MG 24 hr tablet Take 1 tablet (25 mg total) by mouth daily. 06/20/18  Yes Sowles, Drue Stager, MD  rosuvastatin (CRESTOR) 40 MG tablet Take 1 tablet (40 mg total) by mouth daily. 06/20/18  07/30/18 Yes Steele Sizer, MD  aspirin EC 81 MG tablet Take 81 mg by mouth daily.    [provider]  famotidine (PEPCID) 20 MG tablet Take 1 tablet (20 mg total) by mouth 2 (two) times daily. 06/20/18 07/20/18  Steele Sizer, MD  gabapentin (NEURONTIN) 300 MG capsule Take 1 capsule (300 mg total) by mouth 2 (two) times daily. 06/20/18 07/20/18  Steele Sizer, MD  insulin glargine (LANTUS) 100 UNIT/ML injection Inject 0.1 mLs (10 Units total)  into the skin daily. 06/20/18 07/20/18  Steele Sizer, MD  lacosamide 100 MG TABS Take 1 tablet (100 mg total) by mouth 2 (two) times daily. Patient not taking: Reported on 07/30/2018 11/05/17   Fritzi Mandes, MD  promethazine (PHENERGAN) 12.5 MG tablet Take 1 tablet (12.5 mg total) by mouth every 6 (six) hours as needed for nausea or vomiting. Patient not taking: Reported on 07/30/2018 08/24/17   Stark Klein, MD  sertraline (ZOLOFT) 25 MG tablet Take 1 tablet (25 mg total) by mouth daily. Patient not taking: Reported on 07/30/2018 06/20/18   Steele Sizer, MD   Allergies No Known Allergies  Family History History reviewed. No pertinent family history. Social History  reports that he has never smoked. He has never used smokeless tobacco. He reports previous alcohol use. He reports previous drug use.  Review Of Systems:   Constitutional: Positive for fever and chills that has resolved.  HENT: Negative for congestion and rhinorrhea.  Eyes: Negative for redness and visual disturbance.  Respiratory: Negative for shortness of breath and wheezing.  Cardiovascular: Negative for chest pain and palpitations.  Gastrointestinal: Positive for nausea , vomiting and abdominal pain and  Loose stools that has improved Genitourinary: Negative for dysuria and urgency.  Endocrine: Denies polyuria, polyphagia and heat intolerance Musculoskeletal: Negative for myalgias and arthralgias.  Skin: Negative for pallor and wound.  Neurological: Negative for dizziness and headaches   VITAL SIGNS: BP 119/72   Pulse 68   Temp 99.3 F (37.4 C) (Oral)   Resp (!) 27   Ht 5\' 9"  (1.753 m)   Wt 109.8 kg   SpO2 92%   BMI 35.74 kg/m   HEMODYNAMICS:    VENTILATOR SETTINGS:    INTAKE / OUTPUT: I/O last 3 completed shifts: In: 5470.8 [P.O.:200; I.V.:870.8; IV Piggyback:4400] Out: -   PHYSICAL EXAMINATION: General: No acute distress HEENT: PERRLA, trachea midline, no JVD Neuro: Alert and oriented x4, no  focal deficits Cardiovascular: Apical pulse regular, S1-S2, no murmur regurg or gallop, +2 pulses bilaterally, no edema Lungs: Clear to auscultation bilaterally Abdomen: Obese, nontender, normal bowel sounds in all 4 quadrants Musculoskeletal: Positive range of motion, no joint deformities Skin: Warm and dry  LABS:  BMET Recent Labs  Lab 10/21/18 0754  NA 140  K 4.1  CL 99  CO2 28  BUN 26*  CREATININE 1.42*  GLUCOSE 199*    Electrolytes Recent Labs  Lab 10/21/18 0754  CALCIUM 9.5    CBC Recent Labs  Lab 10/21/18 0754  WBC 10.1  HGB 15.5  HCT 46.4  PLT 262    Coag's No results for input(s): APTT, INR in the last 168 hours.  Sepsis Markers Recent Labs  Lab 10/21/18 0754 10/21/18 1227  LATICACIDVEN  --  3.46*  PROCALCITON 5.23  --     ABG No results for input(s): PHART, PCO2ART, PO2ART in the last 168 hours.  Liver Enzymes Recent Labs  Lab 10/21/18 0754  AST 37  ALT 70*  ALKPHOS 55  BILITOT 0.8  ALBUMIN 4.4    Cardiac Enzymes Recent Labs  Lab 10/21/18 0754 10/21/18 1212  TROPONINI <0.03 <0.03    Glucose Recent Labs  Lab 10/21/18 1505  GLUCAP 222*    Imaging Ct Head Wo Contrast  Result Date: 10/21/2018 CLINICAL DATA:  Headache with nausea and vomiting EXAM: CT HEAD WITHOUT CONTRAST TECHNIQUE: Contiguous axial images were obtained from the base of the skull through the vertex without intravenous contrast. COMPARISON:  None. FINDINGS: Brain: The ventricles are normal in size and configuration. There is no intracranial mass, hemorrhage, extra-axial fluid collection, or midline shift. The brain parenchyma appears unremarkable. No acute infarct evident. Vascular: There is no hyperdense vessel. There are foci of calcification in each carotid siphon region. Skull: The bony calvarium appears intact. Sinuses/Orbits: There is mucosal thickening in several ethmoid air cells. Other visualized paranasal sinuses are clear. Orbits appear symmetric  bilaterally. Other: Visualized mastoid air cells are clear. IMPRESSION: Brain parenchyma appears unremarkable.  No mass or hemorrhage. Foci of arterial vascular calcification noted. There is mucosal thickening in several ethmoid air cells. Electronically Signed   By: Lowella Grip III M.D.   On: 10/21/2018 08:57   Dg Chest Port 1 View  Result Date: 10/21/2018 CLINICAL DATA:  Nausea, vomiting and diarrhea beginning early this morning. Chills and body aches. EXAM: PORTABLE CHEST 1 VIEW COMPARISON:  None. FINDINGS: The heart size and mediastinal contours are within normal limits. Both lungs are clear. No pleural effusion or pneumothorax. The visualized skeletal structures are unremarkable. IMPRESSION: No active disease. Electronically Signed   By: Lajean Manes M.D.   On: 10/21/2018 11:54   STUDIES:  None  CULTURES: Stool culture Urine culture Blood cultures  ANTIBIOTICS: Flagyl Cefepime Vancomycin  SIGNIFICANT EVENTS: 10/21/2018: Admitted  LINES/TUBES: Peripheral IVs  DISCUSSION: 52 year old male presenting with acute gastroenteritis, sepsis of GI source and septic shock  ASSESSMENT Septic shock Sepsis secondary to gastroenteritis Acute gastroenteritis-exact etiology unclear Acute renal failure Type 2 diabetes  PLAN Hemodynamic monitoring per ICU protocol IV fluids and pressors to maintain mean arterial blood pressure greater than 65 Antibiotics as above Follow-up cultures Trend creatinine Monitor and correct electrolytes Blood glucose monitoring with sliding scale insulin coverage  Best Practice: Code Status: Full code Diet: Clear liquids GI prophylaxis: Not indicated VTE prophylaxis: Lovenox  FAMILY  - Updates: No family at bedside.  Will update when available.   S. [facility] ANP-BC Pulmonary and Critical Care Medicine [facility] Pager [phone] or [phone]  NB: This document was prepared using Dragon voice recognition software and  may include unintentional dictation errors.    10/21/2018, 10:20 PM

## 2018-10-21 NOTE — Progress Notes (Signed)
Notified Maggie, NP that patient has hx of DM with no CBG order. CBG currently 178. See new orders

## 2018-10-21 NOTE — ED Provider Notes (Addendum)
[facility] Emergency Department Provider Note  Time seen: 7:45 AM  I have reviewed the triage vital signs and the nursing notes.   HISTORY  Chief Complaint Emesis and Diarrhea    HPI Johnny Barrera. is a 52 y.o. male with a past medical history of diabetes, gastric reflux, hypertension, hyperlipidemia presents to the emergency department for nausea and vomiting and diarrhea.  According to the patient he states he has been feeling somewhat weak over the past several weeks, but overall not too bad.  He awoke at 330 this morning with acute onset of nausea vomiting and diarrhea.  States he is vomited multiple times, significant amount of diarrhea, so he called EMS.  Denies any abdominal pain.  Denies any recent fever cough or congestion.   Past Medical History:  Diagnosis Date  . Diabetes mellitus without complication ([place])   . GERD (gastroesophageal reflux disease)   . Hypercholesterolemia   . Hypertension     There are no active problems to display for this patient.   Past Surgical History:  Procedure Laterality Date  . COLONOSCOPY WITH PROPOFOL N/A 06/26/2016   Procedure: COLONOSCOPY WITH PROPOFOL;  Surgeon: Manya Silvas, MD;  Location: [facility] ENDOSCOPY;  Service: Endoscopy;  Laterality: N/A;    Prior to Admission medications   Medication Sig Start Date End Date Taking? Authorizing Provider  aspirin EC 81 MG tablet Take 81 mg by mouth daily.    [provider]  lisinopril (PRINIVIL,ZESTRIL) 40 MG tablet Take 40 mg by mouth daily.    [provider]  metFORMIN (GLUCOPHAGE) 1000 MG tablet Take 1,000 mg by mouth 2 (two) times daily. 06/25/17   [provider]  metoprolol tartrate (LOPRESSOR) 50 MG tablet Take 50 mg by mouth 2 (two) times daily.    [provider]  Multiple Vitamins-Minerals (MULTIVITAMIN PO) Take 1 tablet by mouth daily.    [provider]  naproxen sodium (ALEVE) 220 MG tablet Take 220 mg by  mouth 2 (two) times daily as needed (for pain or headache).    [provider]  simvastatin (ZOCOR) 40 MG tablet Take 40 mg by mouth every evening.     [provider]    No Known Allergies  No family history on file.  Social History Social History   Tobacco Use  . Smoking status: Never Smoker  . Smokeless tobacco: Never Used  Substance Use Topics  . Alcohol use: Not on file  . Drug use: Not on file    Review of Systems Constitutional: Negative for fever. Cardiovascular: Negative for chest pain. Respiratory: Negative for shortness of breath. Gastrointestinal: Negative for abdominal pain.  Positive for nausea vomiting diarrhea Genitourinary: Negative for urinary compaints Musculoskeletal: Negative for musculoskeletal complaints Skin: Negative for skin complaints  Neurological: Negative for headache All other ROS negative  ____________________________________________   PHYSICAL EXAM:  Constitutional: Alert and oriented. Well appearing and in no distress. Eyes: Normal exam ENT   Head: Normocephalic and atraumatic.   Mouth/Throat: Mucous membranes are moist. Cardiovascular: Normal rate, regular rhythm.  Respiratory: Normal respiratory effort without tachypnea nor retractions. Breath sounds are clear  Gastrointestinal: Soft and nontender. No distention.   Musculoskeletal: Nontender with normal range of motion in all extremities.  Neurologic:  Normal speech and language. No gross focal neurologic deficits Skin:  Skin is warm, dry and intact.  Psychiatric: Mood and affect are normal.   ____________________________________________    EKG  EKG viewed and interpreted by myself shows  sinus tachycardia 120 bpm with a narrow QRS, rightward axis, largely normal intervals, nonspecific ST changes without ST elevation.  CT head negative.  ____________________________________________   INITIAL IMPRESSION / ASSESSMENT AND PLAN / ED COURSE  Pertinent  labs & imaging results that were available during my care of the patient were reviewed by me and considered in my medical decision making (see chart for details).  Patient presents to the emergency department for nausea vomiting diarrhea since 330 this morning.  States he developed a wave of nausea and flushing across his body, followed by profuse nausea vomiting and multiple episodes of very loose diarrhea.  Patient states after approximately 4 hours of nausea vomiting diarrhea he became concerned so he called EMS for transport to the ER.  Patient symptoms are very suggestive of gastroenteritis.  Received Zofran by EMS.  Received 500 cc of fluid by EMS.  We will dose 1 L of normal saline and continue to closely monitor.  We will check labs as a precaution.  Patient now complaining of mild to moderate headache as well.  States he started with a headache when the vomiting started this morning as well.  Given the onset of headache and flushing sensation followed by nausea vomiting diarrhea I believe it would be prudent to check a CT scan of the head to ensure no intracranial abnormality.  Patient agreeable to plan of care.  Very likely the headache could be result of the retching/vomiting/diarrhea.  Labs are largely within normal limits besides mild renal insufficiency.  CT scan was largely negative for acute/concerning abnormality.  We will continue with IV hydration and treat nausea in the emergency department.  Highly suspect gastroenteritis given acute onset of nausea vomiting diarrhea.  Patient remains somewhat tachycardic around 115 bpm.  Patient noted now to be febrile to 101.  We will check an influenza swab.  We will continue to IV hydrate as the patient's blood pressure is borderline low.  We will dose Tylenol for fever and continue to closely monitor.  Patient remains hypotensive in the 80s/90s remains tachycardic around 120.  Influenza test is negative.  As the patient remains tachycardic and  now hypotensive with a fever meeting sepsis criteria I have ordered IV antibiotics, blood cultures, lactic acid.  Patient will be admitted to the hospital service for ongoing treatment.  Continues to have no abdominal pain whatsoever.  I ordered stool antigen testing, but patient has been unable to provide a stool sample in the ED.  CRITICAL CARE Performed by: Harvest Dark   Total critical care time: 30 minutes  Critical care time was exclusive of separately billable procedures and treating other patients.  Critical care was necessary to treat or prevent imminent or life-threatening deterioration.  Critical care was time spent personally by me on the following activities: development of treatment plan with patient and/or surrogate as well as nursing, discussions with consultants, evaluation of patient's response to treatment, examination of patient, obtaining history from patient or surrogate, ordering and performing treatments and interventions, ordering and review of laboratory studies, ordering and review of radiographic studies, pulse oximetry and re-evaluation of patient's condition.   ____________________________________________   FINAL CLINICAL IMPRESSION(S) / ED DIAGNOSES  Gastroenteritis Headache sepsis   Harvest Dark, MD 10/21/18 [id number]    Harvest Dark, MD 10/21/18 1139

## 2018-10-22 DIAGNOSIS — A419 Sepsis, unspecified organism: Secondary | ICD-10-CM

## 2018-10-22 DIAGNOSIS — R6521 Severe sepsis with septic shock: Secondary | ICD-10-CM

## 2018-10-22 DIAGNOSIS — A0811 Acute gastroenteropathy due to Norwalk agent: Secondary | ICD-10-CM

## 2018-10-22 DIAGNOSIS — K529 Noninfective gastroenteritis and colitis, unspecified: Secondary | ICD-10-CM

## 2018-10-22 DIAGNOSIS — R6511 Systemic inflammatory response syndrome (SIRS) of non-infectious origin with acute organ dysfunction: Secondary | ICD-10-CM

## 2018-10-22 LAB — BASIC METABOLIC PANEL
Anion gap: 7 (ref 5–15)
BUN: 29 mg/dL — AB (ref 6–20)
CO2: 21 mmol/L — ABNORMAL LOW (ref 22–32)
CREATININE: 1.52 mg/dL — AB (ref 0.61–1.24)
Calcium: 7.2 mg/dL — ABNORMAL LOW (ref 8.9–10.3)
Chloride: 107 mmol/L (ref 98–111)
GFR calc Af Amer: >60 mL/min (ref >60)
GFR calc non Af Amer: 52 mL/min — ABNORMAL LOW (ref >60)
GLUCOSE: 147 mg/dL — AB (ref 70–99)
Potassium: 3.2 mmol/L — ABNORMAL LOW (ref 3.5–5.1)
Sodium: 135 mmol/L (ref 135–145)

## 2018-10-22 LAB — PHOSPHORUS: Phosphorus: 2 mg/dL — ABNORMAL LOW (ref 2.5–4.6)

## 2018-10-22 LAB — GLUCOSE, CAPILLARY
GLUCOSE-CAPILLARY: 195 mg/dL — AB (ref 70–99)
Glucose-Capillary: 137 mg/dL — ABNORMAL HIGH (ref 70–99)
Glucose-Capillary: 145 mg/dL — ABNORMAL HIGH (ref 70–99)
Glucose-Capillary: 151 mg/dL — ABNORMAL HIGH (ref 70–99)
Glucose-Capillary: 204 mg/dL — ABNORMAL HIGH (ref 70–99)
Glucose-Capillary: 223 mg/dL — ABNORMAL HIGH (ref 70–99)

## 2018-10-22 LAB — GASTROINTESTINAL PANEL BY PCR, STOOL (REPLACES STOOL CULTURE)
Adenovirus F40/41: NOT DETECTED
Astrovirus: NOT DETECTED
Campylobacter species: NOT DETECTED
Cryptosporidium: NOT DETECTED
Cyclospora cayetanensis: NOT DETECTED
ENTEROAGGREGATIVE E COLI (EAEC): NOT DETECTED
Entamoeba histolytica: NOT DETECTED
Enteropathogenic E coli (EPEC): NOT DETECTED
Enterotoxigenic E coli (ETEC): NOT DETECTED
Giardia lamblia: NOT DETECTED
Norovirus GI/GII: DETECTED — AB
Plesimonas shigelloides: NOT DETECTED
Rotavirus A: NOT DETECTED
SALMONELLA SPECIES: NOT DETECTED
Sapovirus (I, II, IV, and V): NOT DETECTED
Shiga like toxin producing E coli (STEC): NOT DETECTED
Shigella/Enteroinvasive E coli (EIEC): NOT DETECTED
Vibrio cholerae: NOT DETECTED
Vibrio species: NOT DETECTED
Yersinia enterocolitica: NOT DETECTED

## 2018-10-22 LAB — CBC
HCT: 34.2 % — ABNORMAL LOW (ref 39.0–52.0)
Hemoglobin: 11.4 g/dL — ABNORMAL LOW (ref 13.0–17.0)
MCH: 29.2 pg (ref 26.0–34.0)
MCHC: 33.3 g/dL (ref 30.0–36.0)
MCV: 87.7 fL (ref 80.0–100.0)
Platelets: 177 10*3/uL (ref 150–400)
RBC: 3.9 MIL/uL — ABNORMAL LOW (ref 4.22–5.81)
RDW: 12.5 % (ref 11.5–15.5)
WBC: 4.9 10*3/uL (ref 4.0–10.5)
nRBC: 0 % (ref 0.0–0.2)

## 2018-10-22 LAB — LACTIC ACID, PLASMA
LACTIC ACID, VENOUS: 1.1 mmol/L (ref 0.5–1.9)
Lactic Acid, Venous: 1.5 mmol/L (ref 0.5–1.9)

## 2018-10-22 LAB — MAGNESIUM: Magnesium: 1.5 mg/dL — ABNORMAL LOW (ref 1.7–2.4)

## 2018-10-22 LAB — PROCALCITONIN: Procalcitonin: 10.58 ng/mL

## 2018-10-22 MED ORDER — KCL IN DEXTROSE-NACL 40-5-0.45 MEQ/L-%-% IV SOLN
INTRAVENOUS | Status: DC
  Administered 2018-10-22 – 2018-10-24 (×5): via INTRAVENOUS
  Filled 2018-10-22 (×8): qty 1000

## 2018-10-22 MED ORDER — OXYCODONE-ACETAMINOPHEN 5-325 MG PO TABS
1.0000 | ORAL_TABLET | ORAL | Status: DC | PRN
  Administered 2018-10-22 – 2018-10-24 (×4): 1 via ORAL
  Filled 2018-10-22 (×4): qty 1

## 2018-10-22 NOTE — Progress Notes (Addendum)
Follow up - Critical Care Medicine Note  Patient Details:    Johnny Barrera. is an 52 y.o. male. 52 year old male who presented with nausea vomiting and diarrhea, ruled in for sepsis of GI source and septic shock.  Admitted to the ICU for pressors.  Lines, Airways, Drains:    Anti-infectives:  Anti-infectives (From admission, onward)   Start     Dose/Rate Route Frequency Ordered Stop   10/21/18 2200  ceFEPIme (MAXIPIME) 2 g in sodium chloride 0.9 % 100 mL IVPB  Status:  Discontinued     2 g 200 mL/hr over 30 Minutes Intravenous Every 12 hours 10/21/18 1232 10/22/18 0952   10/21/18 2000  vancomycin (VANCOCIN) 1,250 mg in sodium chloride 0.9 % 250 mL IVPB  Status:  Discontinued     1,250 mg 166.7 mL/hr over 90 Minutes Intravenous Every 12 hours 10/21/18 1232 10/22/18 0959   10/21/18 1145  ceFEPIme (MAXIPIME) 2 g in sodium chloride 0.9 % 100 mL IVPB     2 g 200 mL/hr over 30 Minutes Intravenous  Once 10/21/18 1137 10/21/18 1225   10/21/18 1145  metroNIDAZOLE (FLAGYL) IVPB 500 mg  Status:  Discontinued     500 mg 100 mL/hr over 60 Minutes Intravenous Every 8 hours 10/21/18 1137 10/22/18 1007   10/21/18 1145  vancomycin (VANCOCIN) IVPB 1000 mg/200 mL premix     1,000 mg 200 mL/hr over 60 Minutes Intravenous  Once 10/21/18 1137 10/21/18 1337      Microbiology: Results for orders placed or performed during the hospital encounter of 10/21/18  Blood Culture (routine x 2)     Status: None (Preliminary result)   Collection Time: 10/21/18 12:12 PM  Result Value Ref Range Status   Specimen Description BLOOD  Final   Special Requests   Final    BOTTLES DRAWN AEROBIC AND ANAEROBIC Blood Culture results may not be optimal due to an excessive volume of blood received in culture bottles   Culture   Final    NO GROWTH < 24 HOURS Performed at [facility], [address], [place], [place] [address]    Report Status PENDING  Incomplete  Blood Culture (routine x 2)     Status:  None (Preliminary result)   Collection Time: 10/21/18 12:16 PM  Result Value Ref Range Status   Specimen Description BLOOD  Final   Special Requests   Final    BOTTLES DRAWN AEROBIC AND ANAEROBIC Blood Culture adequate volume   Culture   Final    NO GROWTH < 24 HOURS Performed at [facility], [address] [place], [place] [address]    Report Status PENDING  Incomplete  MRSA PCR Screening     Status: None   Collection Time: 10/21/18  3:02 PM  Result Value Ref Range Status   MRSA by PCR NEGATIVE NEGATIVE Final    Comment:        The GeneXpert MRSA Assay (FDA approved for NASAL specimens only), is one component of a comprehensive MRSA colonization surveillance program. It is not intended to diagnose MRSA infection nor to guide or monitor treatment for MRSA infections. Performed at [facility], [address] [place],  [address]   Gastrointestinal Panel by PCR , Stool     Status: Abnormal   Collection Time: 10/22/18  4:54 AM  Result Value Ref Range Status   Campylobacter species NOT DETECTED NOT DETECTED Final   Plesimonas shigelloides NOT DETECTED NOT DETECTED Final   Salmonella species  NOT DETECTED NOT DETECTED Final   Yersinia enterocolitica NOT DETECTED NOT DETECTED Final   Vibrio species NOT DETECTED NOT DETECTED Final   Vibrio cholerae NOT DETECTED NOT DETECTED Final   Enteroaggregative E coli (EAEC) NOT DETECTED NOT DETECTED Final   Enteropathogenic E coli (EPEC) NOT DETECTED NOT DETECTED Final   Enterotoxigenic E coli (ETEC) NOT DETECTED NOT DETECTED Final   Shiga like toxin producing E coli (STEC) NOT DETECTED NOT DETECTED Final   Shigella/Enteroinvasive E coli (EIEC) NOT DETECTED NOT DETECTED Final   Cryptosporidium NOT DETECTED NOT DETECTED Final   Cyclospora cayetanensis NOT DETECTED NOT DETECTED Final   Entamoeba histolytica NOT DETECTED NOT DETECTED Final   Giardia lamblia NOT DETECTED NOT DETECTED Final   Adenovirus F40/41 NOT  DETECTED NOT DETECTED Final   Astrovirus NOT DETECTED NOT DETECTED Final   Norovirus GI/GII DETECTED (A) NOT DETECTED Final    Comment: RESULT CALLED TO, READ BACK BY AND VERIFIED WITH: Ricci Barker AT 0705 ON 10/22/18 BY SNJ    Rotavirus A NOT DETECTED NOT DETECTED Final   Sapovirus (I, II, IV, and V) NOT DETECTED NOT DETECTED Final    Comment: Performed at [facility], [address], [place], [place] [address]    Best Practice/Protocols:  VTE Prophylaxis: Lovenox (prophylaxtic dose)   Events: Admitted to ICU/stepdown 10/21/2018  Studies: Ct Head Wo Contrast  Result Date: 10/21/2018 CLINICAL DATA:  Headache with nausea and vomiting EXAM: CT HEAD WITHOUT CONTRAST TECHNIQUE: Contiguous axial images were obtained from the base of the skull through the vertex without intravenous contrast. COMPARISON:  None. FINDINGS: Brain: The ventricles are normal in size and configuration. There is no intracranial mass, hemorrhage, extra-axial fluid collection, or midline shift. The brain parenchyma appears unremarkable. No acute infarct evident. Vascular: There is no hyperdense vessel. There are foci of calcification in each carotid siphon region. Skull: The bony calvarium appears intact. Sinuses/Orbits: There is mucosal thickening in several ethmoid air cells. Other visualized paranasal sinuses are clear. Orbits appear symmetric bilaterally. Other: Visualized mastoid air cells are clear. IMPRESSION: Brain parenchyma appears unremarkable.  No mass or hemorrhage. Foci of arterial vascular calcification noted. There is mucosal thickening in several ethmoid air cells. Electronically Signed   By: Lowella Grip III M.D.   On: 10/21/2018 08:57   Dg Chest Port 1 View  Result Date: 10/21/2018 CLINICAL DATA:  Nausea, vomiting and diarrhea beginning early this morning. Chills and body aches. EXAM: PORTABLE CHEST 1 VIEW COMPARISON:  None. FINDINGS: The heart size and mediastinal contours are within normal  limits. Both lungs are clear. No pleural effusion or pneumothorax. The visualized skeletal structures are unremarkable. IMPRESSION: No active disease. Electronically Signed   By: Lajean Manes M.D.   On: 10/21/2018 11:54    Consults:    Subjective:    Overnight Issues:  Overall feels better, continues with diarrhea but no longer has nausea.  He is almost completely of norepinephrine. Objective:  Vital signs for last 24 hours: Temp:  [98.3 F (36.8 C)-101 F (38.3 C)] 98.7 F (37.1 C) (12/28 1600) Pulse Rate:  [68-97] 73 (12/28 1800) Resp:  [14-29] 17 (12/28 1800) BP: (91-123)/(48-72) 118/68 (12/28 1800) SpO2:  [92 %-98 %] 95 % (12/28 1800)  Hemodynamic parameters for last 24 hours:  Reviewed Intake/Output from previous day: 12/27 0701 - 12/28 0700 In: 7789.3 [P.O.:200; I.V.:2614.1; IV Piggyback:4975.2] Out: 750 [Urine:750]  Intake/Output this shift: Total I/O In: 596.9 [I.V.:596.9] Out: -   Vent settings for last 24  hours: N/A   Physical Exam:  General:  Sleepy, easily awakened.  No acute distress HEENT: PERRLA, trachea midline, no JVD Neuro: Alert and oriented x4, no focal deficits Cardiovascular: Regular rate and rhythm, intact distal pulses. Lungs: Clear to auscultation bilaterally Abdomen: Obese, nontender, somewhat hyperactive sounds Musculoskeletal:  No cyanosis clubbing or edema.   Skin: Warm and dry  Assessment/Plan:  1.  Shock due to systemic inflammatory response (SIRS)/sepsis from norovirus gastroenteritis and volume depletion associated with the same: Patient being weaned off of pressors, continue volume resuscitation with IV fluids until taking p.o.reliably.  2.  Norovirus gastroenteritis: Supportive care.  Institute enteric precautions isolation.  Discontinue antibiotics as not indicated and could lead to C. difficile infection.  3.  Acute kidney injury: Likely due to combination of volume depletion and ATN and SIRS.  Avoid nephrotoxins.  Continue volume  resuscitation.  Avoid hypotension.  Monitor renal function daily.  4.  Lactic acidosis: Resolved.  May change status to stepdown status.   LOS: 1 day   Additional comments: Personally reviewed all the laboratory data available.  Updated the patient's wife at bedside.  Renold Don, MD [facility]  10/22/2018  *

## 2018-10-22 NOTE — Progress Notes (Signed)
[place] at [place] NAME: Johnny Barrera    MR#:  [id number]  DATE OF BIRTH:  04-30-1966  SUBJECTIVE:  CHIEF COMPLAINT:   Chief Complaint  Patient presents with  . Emesis  . Diarrhea  Patient seen and evaluated today Still has some diarrhea Blood pressure improved with IV fluids No fever No vomiting  REVIEW OF SYSTEMS:    ROS  CONSTITUTIONAL: No documented fever. Has fatigue, weakness. No weight gain, no weight loss.  EYES: No blurry or double vision.  ENT: No tinnitus. No postnasal drip. No redness of the oropharynx.  RESPIRATORY: No cough, no wheeze, no hemoptysis. No dyspnea.  CARDIOVASCULAR: No chest pain. No orthopnea. No palpitations. No syncope.  GASTROINTESTINAL: No nausea, no vomiting , has diarrhea. No abdominal pain. No melena or hematochezia.  GENITOURINARY: No dysuria or hematuria.  ENDOCRINE: No polyuria or nocturia. No heat or cold intolerance.  HEMATOLOGY: No anemia. No bruising. No bleeding.  INTEGUMENTARY: No rashes. No lesions.  MUSCULOSKELETAL: No arthritis. No swelling. No gout.  NEUROLOGIC: No numbness, tingling, or ataxia. No seizure-type activity.  PSYCHIATRIC: No anxiety. No insomnia. No ADD.   DRUG ALLERGIES:  No Known Allergies  VITALS:  Blood pressure 115/72, pulse 69, temperature (!) 100.7 F (38.2 C), temperature source Oral, resp. rate (!) 22, height 5\' 9"  (1.753 m), weight 109.8 kg, SpO2 92 %.  PHYSICAL EXAMINATION:   Physical Exam  GENERAL:  52 y.o.-year-old patient lying in the bed with no acute distress.  EYES: Pupils equal, round, reactive to light and accommodation. No scleral icterus. Extraocular muscles intact.  HEENT: Head atraumatic, normocephalic. Oropharynx dry and nasopharynx clear.  NECK:  Supple, no jugular venous distention. No thyroid enlargement, no tenderness.  LUNGS: Normal breath sounds bilaterally, no wheezing, rales, rhonchi. No use of accessory muscles of respiration.   CARDIOVASCULAR: S1, S2 normal. No murmurs, rubs, or gallops.  ABDOMEN: Soft, nontender, nondistended. Bowel sounds present. No organomegaly or mass.  EXTREMITIES: No cyanosis, clubbing or edema b/l.    NEUROLOGIC: Cranial nerves II through XII are intact. No focal Motor or sensory deficits b/l.   PSYCHIATRIC: The patient is alert and oriented x 3.  SKIN: No obvious rash, lesion, or ulcer.   LABORATORY PANEL:   CBC Recent Labs  Lab 10/22/18 0557  WBC 4.9  HGB 11.4*  HCT 34.2*  PLT 177   ------------------------------------------------------------------------------------------------------------------ Chemistries  Recent Labs  Lab 10/21/18 0754 10/22/18 0557  NA 140 135  K 4.1 3.2*  CL 99 107  CO2 28 21*  GLUCOSE 199* 147*  BUN 26* 29*  CREATININE 1.42* 1.52*  CALCIUM 9.5 7.2*  MG  --  1.5*  AST 37  --   ALT 70*  --   ALKPHOS 55  --   BILITOT 0.8  --    ------------------------------------------------------------------------------------------------------------------  Cardiac Enzymes Recent Labs  Lab 10/21/18 1212  TROPONINI <0.03   ------------------------------------------------------------------------------------------------------------------  RADIOLOGY:  Ct Head Wo Contrast  Result Date: 10/21/2018 CLINICAL DATA:  Headache with nausea and vomiting EXAM: CT HEAD WITHOUT CONTRAST TECHNIQUE: Contiguous axial images were obtained from the base of the skull through the vertex without intravenous contrast. COMPARISON:  None. FINDINGS: Brain: The ventricles are normal in size and configuration. There is no intracranial mass, hemorrhage, extra-axial fluid collection, or midline shift. The brain parenchyma appears unremarkable. No acute infarct evident. Vascular: There is no hyperdense vessel. There are foci of calcification in each carotid siphon region. Skull: The bony calvarium appears  intact. Sinuses/Orbits: There is mucosal thickening in several ethmoid air cells.  Other visualized paranasal sinuses are clear. Orbits appear symmetric bilaterally. Other: Visualized mastoid air cells are clear. IMPRESSION: Brain parenchyma appears unremarkable.  No mass or hemorrhage. Foci of arterial vascular calcification noted. There is mucosal thickening in several ethmoid air cells. Electronically Signed   By: Lowella Grip III M.D.   On: 10/21/2018 08:57   Dg Chest Port 1 View  Result Date: 10/21/2018 CLINICAL DATA:  Nausea, vomiting and diarrhea beginning early this morning. Chills and body aches. EXAM: PORTABLE CHEST 1 VIEW COMPARISON:  None. FINDINGS: The heart size and mediastinal contours are within normal limits. Both lungs are clear. No pleural effusion or pneumothorax. The visualized skeletal structures are unremarkable. IMPRESSION: No active disease. Electronically Signed   By: Lajean Manes M.D.   On: 10/21/2018 11:54     ASSESSMENT AND PLAN:   52 year old male patient with history of type 2 diabetes mellitus, GERD, hyperlipidemia, hypertension currently in the stepdown unit for hypotension, sepsis and diarrhea  -Systemic inflammatory response syndrome Secondary to norovirus gastroenteritis IV fluids Wean IV pressors Contact precautions Antibiotics stopped  -Norovirus gastroenteritis IV fluids and supportive care  -Hypotension IV fluids Wean IV pressors as blood pressure improves  -DVT prophylaxis subcu Lovenox daily  -Acute hypokalemia Replace potassium aggressively  All the records are reviewed and case discussed with Care Management/Social Worker. Management plans discussed with the patient, family and they are in agreement.  CODE STATUS: Full code  DVT Prophylaxis: SCDs  TOTAL TIME TAKING CARE OF THIS PATIENT: 35 minutes.   POSSIBLE D/C IN 2 to 3 DAYS, DEPENDING ON CLINICAL CONDITION.  Saundra Shelling M.D on 10/22/2018 at 2:22 PM  Between 7am to 6pm - Pager - [phone]  After 6pm go to [email or web address] - password [email or web address]  [facility]  Office  [phone]  CC: Primary care physician; Baxter Hire, MD  Note: This dictation was prepared with Dragon dictation along with smaller phrase technology. Any transcriptional errors that result from this process are unintentional.

## 2018-10-23 ENCOUNTER — Inpatient Hospital Stay: Payer: Managed Care, Other (non HMO)

## 2018-10-23 ENCOUNTER — Encounter: Payer: Self-pay | Admitting: Radiology

## 2018-10-23 LAB — GLUCOSE, CAPILLARY
GLUCOSE-CAPILLARY: 191 mg/dL — AB (ref 70–99)
Glucose-Capillary: 175 mg/dL — ABNORMAL HIGH (ref 70–99)
Glucose-Capillary: 186 mg/dL — ABNORMAL HIGH (ref 70–99)
Glucose-Capillary: 191 mg/dL — ABNORMAL HIGH (ref 70–99)
Glucose-Capillary: 249 mg/dL — ABNORMAL HIGH (ref 70–99)
Glucose-Capillary: 216 mg/dL — ABNORMAL HIGH (ref 70–99)

## 2018-10-23 LAB — URINE CULTURE: Culture: NO GROWTH

## 2018-10-23 LAB — PROCALCITONIN: Procalcitonin: 4.62 ng/mL

## 2018-10-23 MED ORDER — ZOLPIDEM TARTRATE 5 MG PO TABS
5.0000 mg | ORAL_TABLET | Freq: Every evening | ORAL | Status: DC | PRN
  Administered 2018-10-23: 5 mg via ORAL
  Filled 2018-10-23: qty 1

## 2018-10-23 MED ORDER — PANTOPRAZOLE SODIUM 40 MG PO TBEC
40.0000 mg | DELAYED_RELEASE_TABLET | Freq: Two times a day (BID) | ORAL | Status: DC
  Administered 2018-10-23 – 2018-10-24 (×2): 40 mg via ORAL
  Filled 2018-10-23 (×2): qty 1

## 2018-10-23 MED ORDER — INSULIN ASPART 100 UNIT/ML ~~LOC~~ SOLN
0.0000 [IU] | Freq: Three times a day (TID) | SUBCUTANEOUS | Status: DC
  Administered 2018-10-23 – 2018-10-24 (×3): 5 [IU] via SUBCUTANEOUS
  Filled 2018-10-23 (×3): qty 1

## 2018-10-23 MED ORDER — IOHEXOL 300 MG/ML  SOLN
100.0000 mL | Freq: Once | INTRAMUSCULAR | Status: AC | PRN
  Administered 2018-10-23: 100 mL via INTRAVENOUS

## 2018-10-23 MED ORDER — IBUPROFEN 400 MG PO TABS
600.0000 mg | ORAL_TABLET | Freq: Four times a day (QID) | ORAL | Status: DC | PRN
  Administered 2018-10-23 (×2): 600 mg via ORAL
  Filled 2018-10-23 (×2): qty 2

## 2018-10-23 MED ORDER — METRONIDAZOLE 500 MG PO TABS
500.0000 mg | ORAL_TABLET | Freq: Three times a day (TID) | ORAL | Status: DC
  Administered 2018-10-23 – 2018-10-24 (×3): 500 mg via ORAL
  Filled 2018-10-23 (×5): qty 1

## 2018-10-23 NOTE — Progress Notes (Signed)
Patient arrived from ICU  

## 2018-10-23 NOTE — Accreditation Note (Signed)
Pt had fever during the night. Tmax was 102.4 oral.  Pt was given tylenol 650mg  once and then ibuprofen 600mg .  Pt's fever broke at 4:00am.  Pt is now resting comfortably.  He remains normal sinus rhythm on the cardiac monitor.  Pt remains alert and oriented and on room air.

## 2018-10-23 NOTE — Treatment Plan (Signed)
Before patient left stepdown unit, the patient's wife became very confrontational and stated that the patient has been having abdominal issues for 2 months.  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  The wife however insisted that no one was paying attention to his flight and that he needed to have a CT to rule out obstruction.  I advised her that the current symptoms are related to norovirus.  Again she became confrontational she had also notified nursing supervisor.  A CT scan of the abdomen and pelvis was ordered which I indicated to the patient's wife was not clinically necessary.  The CT shows that the patient has hepatic steatosis which is a diagnosis he is aware of, no acute intra-abdominal process.  No mesenteric adenopathy.  Patient has diverticulosis without diverticulitis.  He has a hiatal hernia.  The patient has complained of reflux symptoms.  We will place him on Protonix twice a day until his symptoms subside he will then discuss with his primary care physician to reduce the dose.  Of note his exam is benign his abdomen is soft and nontender.  I did provide him with a short course of Flagyl given that he has had the norovirus and has diverticulosis. The patient subsequently left the stepdown unit satisfied with the results of above.

## 2018-10-23 NOTE — Progress Notes (Addendum)
Pt was shilvering. Temperature taken immediately, reading at 98.8 orally.

## 2018-10-23 NOTE — Progress Notes (Signed)
Pt's wife was very concerned about Pt's GI blockage, and wanted a [place] or KUB to rule out it. She mentioned that "there must be something else except for the norovirus." Reported to Dr. Patsey Berthold. And she had a talk with Pt's wife afterwards.

## 2018-10-23 NOTE — Progress Notes (Addendum)
Pt was transferring to [place], Rm 214. Report given to [facility]. Pt A+O X4. NSR with tachycardia. Febrile and afebrile today. CT showed diverticulosis. On potassium drip.

## 2018-10-23 NOTE — Progress Notes (Signed)
Patient requesting something to help him sleep tonight.  Order received from Dr Estanislado Pandy for ativan PRN and order given to change CBG's to [facility] and HS

## 2018-10-23 NOTE — Progress Notes (Signed)
[place] at [place] NAME: Johnny Barrera    MR#:  [id number]  DATE OF BIRTH:  03-14-1966  SUBJECTIVE:  CHIEF COMPLAINT:   Chief Complaint  Patient presents with  . Emesis  . Diarrhea  Patient seen and evaluated today Diarrhea has improved Blood pressure improved with IV fluids Not on any IV pressors No fever No vomiting Tolerating diet okay  REVIEW OF SYSTEMS:    ROS  CONSTITUTIONAL: Had fever. Has fatigue, weakness. No weight gain, no weight loss.  EYES: No blurry or double vision.  ENT: No tinnitus. No postnasal drip. No redness of the oropharynx.  RESPIRATORY: No cough, no wheeze, no hemoptysis. No dyspnea.  CARDIOVASCULAR: No chest pain. No orthopnea. No palpitations. No syncope.  GASTROINTESTINAL: No nausea, no vomiting , has no diarrhea. No abdominal pain. No melena or hematochezia.  GENITOURINARY: No dysuria or hematuria.  ENDOCRINE: No polyuria or nocturia. No heat or cold intolerance.  HEMATOLOGY: No anemia. No bruising. No bleeding.  INTEGUMENTARY: No rashes. No lesions.  MUSCULOSKELETAL: No arthritis. No swelling. No gout.  NEUROLOGIC: No numbness, tingling, or ataxia. No seizure-type activity.  PSYCHIATRIC: No anxiety. No insomnia. No ADD.   DRUG ALLERGIES:  No Known Allergies  VITALS:  Blood pressure (!) 159/80, pulse 95, temperature (!) 101.2 F (38.4 C), temperature source Axillary, resp. rate (!) 25, height 5\' 9"  (1.753 m), weight 109.8 kg, SpO2 96 %.  PHYSICAL EXAMINATION:   Physical Exam  GENERAL:  52 y.o.-year-old patient lying in the bed with no acute distress.  EYES: Pupils equal, round, reactive to light and accommodation. No scleral icterus. Extraocular muscles intact.  HEENT: Head atraumatic, normocephalic. Oropharynx dry and nasopharynx clear.  NECK:  Supple, no jugular venous distention. No thyroid enlargement, no tenderness.  LUNGS: Normal breath sounds bilaterally, no wheezing, rales, rhonchi.  No use of accessory muscles of respiration.  CARDIOVASCULAR: S1, S2 normal. No murmurs, rubs, or gallops.  ABDOMEN: Soft, nontender, nondistended. Bowel sounds present. No organomegaly or mass.  EXTREMITIES: No cyanosis, clubbing or edema b/l.    NEUROLOGIC: Cranial nerves II through XII are intact. No focal Motor or sensory deficits b/l.   PSYCHIATRIC: The patient is alert and oriented x 3.  SKIN: No obvious rash, lesion, or ulcer.   LABORATORY PANEL:   CBC Recent Labs  Lab 10/22/18 0557  WBC 4.9  HGB 11.4*  HCT 34.2*  PLT 177   ------------------------------------------------------------------------------------------------------------------ Chemistries  Recent Labs  Lab 10/21/18 0754 10/22/18 0557  NA 140 135  K 4.1 3.2*  CL 99 107  CO2 28 21*  GLUCOSE 199* 147*  BUN 26* 29*  CREATININE 1.42* 1.52*  CALCIUM 9.5 7.2*  MG  --  1.5*  AST 37  --   ALT 70*  --   ALKPHOS 55  --   BILITOT 0.8  --    ------------------------------------------------------------------------------------------------------------------  Cardiac Enzymes Recent Labs  Lab 10/21/18 1212  TROPONINI <0.03   ------------------------------------------------------------------------------------------------------------------  RADIOLOGY:  No results found.   ASSESSMENT AND PLAN:   52 year old male patient with history of type 2 diabetes mellitus, GERD, hyperlipidemia, hypertension currently in the stepdown unit for hypotension, sepsis and diarrhea  -Systemic inflammatory response syndrome improved Secondary to norovirus gastroenteritis IV fluids to continue IV pressors weaned off Contact precautions Antibiotics stopped  -Norovirus gastroenteritis IV fluids and supportive care  -Hypotension resolved IV fluids  -DVT prophylaxis subcu Lovenox daily  -Acute hypokalemia  Replace potassium aggressively  -To medical floor once  bed is available  All the records are reviewed and case  discussed with Care Management/Social Worker. Management plans discussed with the patient, family and they are in agreement.  CODE STATUS: Full code  DVT Prophylaxis: SCDs  TOTAL TIME TAKING CARE OF THIS PATIENT: 34 minutes.   POSSIBLE D/C IN 2 to 3 DAYS, DEPENDING ON CLINICAL CONDITION.  Saundra Shelling M.D on 10/23/2018 at 12:31 PM  Between 7am to 6pm - Pager - [phone]  After 6pm go to [email or web address] - password [email or web address] [facility]  Office  [phone]  CC: Primary care physician; Baxter Hire, MD  Note: This dictation was prepared with Dragon dictation along with smaller phrase technology. Any transcriptional errors that result from this process are unintentional.

## 2018-10-24 LAB — BASIC METABOLIC PANEL
Anion gap: 7 (ref 5–15)
BUN: 9 mg/dL (ref 6–20)
CALCIUM: 8.7 mg/dL — AB (ref 8.9–10.3)
CO2: 24 mmol/L (ref 22–32)
Chloride: 103 mmol/L (ref 98–111)
Creatinine, Ser: 1.06 mg/dL (ref 0.61–1.24)
GFR calc Af Amer: >60 mL/min (ref >60)
GFR calc non Af Amer: >60 mL/min (ref >60)
Glucose, Bld: 231 mg/dL — ABNORMAL HIGH (ref 70–99)
Potassium: 3.8 mmol/L (ref 3.5–5.1)
Sodium: 134 mmol/L — ABNORMAL LOW (ref 135–145)

## 2018-10-24 LAB — HIV ANTIBODY (ROUTINE TESTING W REFLEX): HIV Screen 4th Generation wRfx: NONREACTIVE

## 2018-10-24 LAB — GLUCOSE, CAPILLARY: Glucose-Capillary: 223 mg/dL — ABNORMAL HIGH (ref 70–99)

## 2018-10-24 MED ORDER — METRONIDAZOLE 500 MG PO TABS: 500.0000 mg | ORAL_TABLET | Freq: Three times a day (TID) | ORAL | 0 refills | Status: AC

## 2018-10-24 NOTE — Progress Notes (Signed)
Johnny Barrera was admitted to the [facility] on 10/21/2018 and Discharged  10/24/2018 and Johnny Barrera should be excused from work/school for above days as she was with MR Henrene Pastor at the hospital.  Call Saundra Shelling MD with questions.  Saundra Shelling M.D on 10/24/2018,at 9:57 AM  [place] at [facility]  [phone]

## 2018-10-24 NOTE — Progress Notes (Signed)
Inpatient Diabetes Program Recommendations  AACE/ADA: New Consensus Statement on Inpatient Glycemic Control (2019)  Target Ranges:  Prepandial:   less than 140 mg/dL      Peak postprandial:   less than 180 mg/dL (1-2 hours)      Critically ill patients:  140 - 180 mg/dL  Results for Johnny Barrera, Johnny Barrera (MRN [id number]) as of 10/24/2018 09:49  Ref. Range 10/23/2018 07:39 10/23/2018 11:41 10/23/2018 16:44 10/23/2018 21:21 10/24/2018 07:28  Glucose-Capillary Latest Ref Range: 70 - 99 mg/dL 186 (H) 191 (H) 249 (H) 216 (H) 223 (H)    Review of Glycemic Control  Diabetes history: DM2 Outpatient Diabetes medications: Glipizide 5 mg BID, Metformin 1000 mg BID Current orders for Inpatient glycemic control: Novolog 0-15 units AC&HS  Inpatient Diabetes Program Recommendations:  Insulin - Meal Coverage: While inpatient, please consider ordering Novolog 3 units TID with meals for meal coverage if patient eats at least 50% of meals. HgbA1C: Last A1C was 7.8% on 06/29/18 noted in [place].  Thanks, Barnie Alderman, RN, MSN, CDE Diabetes Coordinator Inpatient Diabetes Program [phone] (Team Pager from 8am to 5pm)

## 2018-10-24 NOTE — Progress Notes (Signed)
Johnny Barrera at [place] was admitted to the [facility] on 10/21/2018 and Discharged  10/24/2018 and should be excused from work/school from 10/21/2018 to 10/27/2018 and  may return to work/school without any restrictions on 10/28/2018.  Call Saundra Shelling MD with questions.  Saundra Shelling M.D on 10/24/2018,at 9:56 AM  [place] at [facility]  [phone]

## 2018-10-24 NOTE — Discharge Summary (Signed)
[facility] NAME: Johnny Barrera    MR#:  [id number]  DATE OF BIRTH:  Aug 26, 1966  DATE OF ADMISSION:  10/21/2018 ADMITTING PHYSICIAN: Saundra Shelling, MD  DATE OF DISCHARGE: 10/24/2018  PRIMARY CARE PHYSICIAN: Baxter Hire, MD   ADMISSION DIAGNOSIS:  Diarrhea, unspecified type [R19.7] Nausea and vomiting, intractability of vomiting not specified, unspecified vomiting type [R11.2] Sepsis, due to unspecified organism, unspecified whether acute organ dysfunction present ([place]) [A41.9] Sepsis ([place]) [A41.9]  DISCHARGE DIAGNOSIS:  Systemic inflammatory response syndrome from norovirus gastroenteritis Hypotension Acute gastroenteritis Dehydration Acute hypokalemia  SECONDARY DIAGNOSIS:   Past Medical History:  Diagnosis Date  . Diabetes mellitus without complication ([place])   . GERD (gastroesophageal reflux disease)   . Hypercholesterolemia   . Hypertension      ADMITTING HISTORY Johnny Barrera  is a 52 y.o. male with a known history of diabetes mellitus type 2, GERD, hyperlipidemia, hypertension presented to the emergency room for fever and generalized weakness.  Patient also had some nausea and vomiting and diarrhea.  Diarrhea is completely resolved after coming to the emergency room.  He had generalized weakness and was not feeling well for the last couple of days.  Had fever this morning.  Evaluation in the emergency room patient was hypotensive and tachycardic.  Code sepsis was called patient received IV fluid boluses and started on broad-spectrum IV antibiotics.  He received 3 L of IV fluids in the emergency room.  Blood pressure improved.  Blood pressure medications for held.  Hospitalist service was consulted for further care.  Urine analysis is pending. patient also complained of headache.  He was worked up with CT head which showed no acute abnormality.  Chest x-ray revealed no pneumonia   HOSPITAL COURSE:  Patient was  admitted to stepdown unit.  Patient received IV fluid boluses in the emergency room.  Blood and urine cultures were sent.  Broad-spectrum IV antibiotics were started.  As patient had low blood pressure IV pressor medication was started.  Cultures did not reveal any growth.  Stool work-up revealed norovirus.  Patient was hydrated with IV fluids in the ICU.  His blood pressure improved.  His antibiotics were narrowed down to Flagyl.  Patient was worked up with CT abdomen which showed no acute pathology except diverticulosis and hepatic steatosis.  Potassium was aggressively replaced.  Patient hemodynamically stable will be discharged home.  CONSULTS OBTAINED:    DRUG ALLERGIES:  No Known Allergies  DISCHARGE MEDICATIONS:   Allergies as of 10/24/2018   No Known Allergies     Medication List    TAKE these medications   aspirin EC 81 MG tablet Take 81 mg by mouth daily.   Fenofibrate 50 MG Caps Take 50 mg by mouth daily.   glipiZIDE 5 MG tablet Commonly known as:  GLUCOTROL Take 5 mg by mouth 2 (two) times daily before a meal.   lisinopril 40 MG tablet Commonly known as:  PRINIVIL,ZESTRIL Take 40 mg by mouth daily.   metFORMIN 1000 MG tablet Commonly known as:  GLUCOPHAGE Take 1,000 mg by mouth 2 (two) times daily.   metoprolol tartrate 50 MG tablet Commonly known as:  LOPRESSOR Take 50 mg by mouth 2 (two) times daily.   metroNIDAZOLE 500 MG tablet Commonly known as:  FLAGYL Take 1 tablet (500 mg total) by mouth every 8 (eight) hours for 5 days.   MULTIVITAMIN PO Take 1 tablet by mouth daily.   naproxen sodium 220  MG tablet Commonly known as:  ALEVE Take 220 mg by mouth 2 (two) times daily as needed (for pain or headache).   simvastatin 40 MG tablet Commonly known as:  ZOCOR Take 40 mg by mouth every evening.       Today  Patient seen and evaluated today No fever No tachycardia Tolerating diet well No diarrhea No abdominal pain Hemodynamically stable and  will be discharged home  VITAL SIGNS:  Blood pressure 125/80, pulse 73, temperature 98.1 F (36.7 C), temperature source Oral, resp. rate 18, height 5\' 9"  (1.753 m), weight 109.8 kg, SpO2 95 %.  I/O:    Intake/Output Summary (Last 24 hours) at 10/24/2018 1047 Last data filed at 10/24/2018 0528 Gross per 24 hour  Intake 1319.26 ml  Output 2750 ml  Net -1430.74 ml    PHYSICAL EXAMINATION:  Physical Exam  GENERAL:  52 y.o.-year-old patient lying in the bed with no acute distress.  LUNGS: Normal breath sounds bilaterally, no wheezing, rales,rhonchi or crepitation. No use of accessory muscles of respiration.  CARDIOVASCULAR: S1, S2 normal. No murmurs, rubs, or gallops.  ABDOMEN: Soft, non-tender, non-distended. Bowel sounds present. No organomegaly or mass.  NEUROLOGIC: Moves all 4 extremities. PSYCHIATRIC: The patient is alert and oriented x 3.  SKIN: No obvious rash, lesion, or ulcer.   DATA REVIEW:   CBC Recent Labs  Lab 10/22/18 0557  WBC 4.9  HGB 11.4*  HCT 34.2*  PLT 177    Chemistries  Recent Labs  Lab 10/21/18 0754 10/22/18 0557 10/24/18 0552  NA 140 135 134*  K 4.1 3.2* 3.8  CL 99 107 103  CO2 28 21* 24  GLUCOSE 199* 147* 231*  BUN 26* 29* 9  CREATININE 1.42* 1.52* 1.06  CALCIUM 9.5 7.2* 8.7*  MG  --  1.5*  --   AST 37  --   --   ALT 70*  --   --   ALKPHOS 55  --   --   BILITOT 0.8  --   --     Cardiac Enzymes Recent Labs  Lab 10/21/18 1212  TROPONINI <0.03    Microbiology Results  Results for orders placed or performed during the hospital encounter of 10/21/18  Blood Culture (routine x 2)     Status: None (Preliminary result)   Collection Time: 10/21/18 12:12 PM  Result Value Ref Range Status   Specimen Description BLOOD  Final   Special Requests   Final    BOTTLES DRAWN AEROBIC AND ANAEROBIC Blood Culture results may not be optimal due to an excessive volume of blood received in culture bottles   Culture   Final    NO GROWTH 3  DAYS Performed at [facility], [address], [place], [place] [address]    Report Status PENDING  Incomplete  Blood Culture (routine x 2)     Status: None (Preliminary result)   Collection Time: 10/21/18 12:16 PM  Result Value Ref Range Status   Specimen Description BLOOD  Final   Special Requests   Final    BOTTLES DRAWN AEROBIC AND ANAEROBIC Blood Culture adequate volume   Culture   Final    NO GROWTH 3 DAYS Performed at [facility], [address] [place],  [address]    Report Status PENDING  Incomplete  MRSA PCR Screening     Status: None   Collection Time: 10/21/18  3:02 PM  Result Value Ref Range Status   MRSA by PCR NEGATIVE NEGATIVE Final  Comment:        The GeneXpert MRSA Assay (FDA approved for NASAL specimens only), is one component of a comprehensive MRSA colonization surveillance program. It is not intended to diagnose MRSA infection nor to guide or monitor treatment for MRSA infections. Performed at [facility], [address] [place], [place] [address]   Urine culture     Status: None   Collection Time: 10/22/18  2:58 AM  Result Value Ref Range Status   Specimen Description   Final    URINE, RANDOM Performed at [facility], [address], [place], [place] [address]    Special Requests   Final    NONE Performed at [facility], [address] [place], [place] [address]    Culture   Final    NO GROWTH Performed at [facility], [place] [address] [place], [place] [address]    Report Status 10/23/2018 FINAL  Final  Gastrointestinal Panel by PCR , Stool     Status: Abnormal   Collection Time: 10/22/18  4:54 AM  Result Value Ref Range Status   Campylobacter species NOT DETECTED NOT DETECTED Final   Plesimonas shigelloides NOT DETECTED NOT DETECTED Final   Salmonella species NOT DETECTED NOT DETECTED Final   Yersinia enterocolitica NOT DETECTED NOT DETECTED Final   Vibrio species  NOT DETECTED NOT DETECTED Final   Vibrio cholerae NOT DETECTED NOT DETECTED Final   Enteroaggregative E coli (EAEC) NOT DETECTED NOT DETECTED Final   Enteropathogenic E coli (EPEC) NOT DETECTED NOT DETECTED Final   Enterotoxigenic E coli (ETEC) NOT DETECTED NOT DETECTED Final   Shiga like toxin producing E coli (STEC) NOT DETECTED NOT DETECTED Final   Shigella/Enteroinvasive E coli (EIEC) NOT DETECTED NOT DETECTED Final   Cryptosporidium NOT DETECTED NOT DETECTED Final   Cyclospora cayetanensis NOT DETECTED NOT DETECTED Final   Entamoeba histolytica NOT DETECTED NOT DETECTED Final   Giardia lamblia NOT DETECTED NOT DETECTED Final   Adenovirus F40/41 NOT DETECTED NOT DETECTED Final   Astrovirus NOT DETECTED NOT DETECTED Final   Norovirus GI/GII DETECTED (A) NOT DETECTED Final    Comment: RESULT CALLED TO, READ BACK BY AND VERIFIED WITH: Ricci Barker AT 0705 ON 10/22/18 BY SN[facility]    Rotavirus A NOT DETECTED NOT DETECTED Final   Sapovirus (I, II, IV, and V) NOT DETECTED NOT DETECTED Final    Comment: Performed at [facility], [address] [place], [place] [address]    RADIOLOGY:  Ct Abdomen Pelvis W Contrast  Result Date: 10/23/2018 CLINICAL DATA:  51 year old with septic shock, likely secondary to a gastrointestinal source. Acute onset of nausea, vomiting and diarrhea. Current history of diabetes and hypertension. EXAM: CT ABDOMEN AND PELVIS WITH CONTRAST TECHNIQUE: Multidetector CT imaging of the abdomen and pelvis was performed using the standard protocol following bolus administration of intravenous contrast. CONTRAST:  154mL OMNIPAQUE IOHEXOL 300 MG/ML IV. Oral contrast was not administered. COMPARISON:  No prior CT. Urinary tract ultrasound 07/11/2018 is correlated. FINDINGS: Lower chest: Minimal dependent atelectasis posteriorly in the lower lobes. Visualized lung bases otherwise clear. Prominent subpleural fat posteriorly in the bases. Heart size upper normal. RIGHT coronary  artery atherosclerosis. Hepatobiliary: Diffuse hepatic steatosis with focal sparing surrounding the gallbladder fossa. No hepatic parenchymal masses. Gallbladder normal in appearance without calcified gallstones. No biliary ductal dilation. Pancreas: Normal in appearance without evidence of mass, ductal dilation, or inflammation. Spleen: Normal in size and appearance. Adrenals/Urinary Tract: Normal appearing adrenal glands. Kidneys normal  in size and appearance without focal parenchymal abnormality. No hydronephrosis. No evidence of urinary tract calculi. Normal appearing urinary bladder. Stomach/Bowel: Small hiatal hernia. Stomach otherwise normal in appearance. Normal-appearing small bowel. Diverticulosis involving the descending and sigmoid colon without evidence of acute diverticulitis. Entire colon relatively decompressed with expected stool burden. Lipoma involving the ileocecal valve. Normal appendix in the RIGHT UPPER pelvis. Vascular/Lymphatic: Mild atherosclerosis involving the abdominal aorta without evidence of aneurysm. Normal-appearing portal venous and systemic venous systems. No pathologic lymphadenopathy. Note is made of a normal appearing lymph node anterior to the tail of the pancreas. Reproductive: Prostate gland and seminal vesicles normal in size and appearance for age. Calcified vasa deferens bilaterally as is often seen in patients with diabetes. Other: Small umbilical hernia containing fat. Musculoskeletal: DISH involving the LOWER thoracic spine. Mild degenerative disc disease at L3-4. Calcification in the POSTERIOR annular fibers at L3-4 and L5-S1. Borderline to mild multifactorial spinal stenosis at L3-4 and L4-5. No acute findings. IMPRESSION: 1. No acute abnormalities involving the abdomen or pelvis. 2. Diffuse hepatic steatosis with focal sparing surrounding the gallbladder fossa. 3. Small hiatal hernia. 4. Diverticulosis involving the descending and sigmoid colon without evidence of  acute diverticulitis. 5. Aortic Atherosclerosis, mild, though somewhat advanced for patient age. (ICD10-170.0) 6. Borderline to mild multifactorial spinal stenosis at L3-4 and L4-5. Electronically Signed   By: Evangeline Dakin M.D.   On: 10/23/2018 13:52    Follow up with PCP in 1 week.  Management plans discussed with the patient, family and they are in agreement.  CODE STATUS: Full code    Code Status Orders  (From admission, onward)         Start     Ordered   10/21/18 1503  Full code  Continuous     10/21/18 1502        Code Status History    This patient has a current code status but no historical code status.      TOTAL TIME TAKING CARE OF THIS PATIENT ON DAY OF DISCHARGE: more than 34 minutes.   Saundra Shelling M.D on 10/24/2018 at 10:47 AM  Between 7am to 6pm - Pager - [phone]  After 6pm go to [email or web address] - password [email or web address] [facility]  Office  [phone]  CC: Primary care physician; Baxter Hire, MD  Note: This dictation was prepared with Dragon dictation along with smaller phrase technology. Any transcriptional errors that result from this process are unintentional.

## 2018-10-26 LAB — CULTURE, BLOOD (ROUTINE X 2)
Culture: NO GROWTH
Culture: NO GROWTH
Special Requests: ADEQUATE

## 2020-11-20 ENCOUNTER — Emergency Department: Payer: Self-pay

## 2020-11-20 ENCOUNTER — Other Ambulatory Visit: Payer: Self-pay

## 2020-11-20 ENCOUNTER — Emergency Department
Admission: EM | Admit: 2020-11-20 | Discharge: 2020-11-20 | Disposition: A | Discharge status: Home / Self Care | Payer: Self-pay | Attending: Emergency Medicine | Admitting: Emergency Medicine

## 2020-11-20 DIAGNOSIS — U071 COVID-19: Secondary | ICD-10-CM | POA: Insufficient documentation

## 2020-11-20 DIAGNOSIS — R058 Other specified cough: Secondary | ICD-10-CM | POA: Insufficient documentation

## 2020-11-20 DIAGNOSIS — Z5321 Procedure and treatment not carried out due to patient leaving prior to being seen by health care provider: Secondary | ICD-10-CM | POA: Insufficient documentation

## 2020-11-20 DIAGNOSIS — J3489 Other specified disorders of nose and nasal sinuses: Secondary | ICD-10-CM | POA: Insufficient documentation

## 2020-11-20 LAB — CBC WITH DIFFERENTIAL/PLATELET
Abs Immature Granulocytes: 0.01 10*3/uL (ref 0.00–0.07)
Basophils Absolute: 0 10*3/uL (ref 0.0–0.1)
Basophils Relative: 0 %
Eosinophils Absolute: 0 10*3/uL (ref 0.0–0.5)
Eosinophils Relative: 0 %
HCT: 42 % (ref 39.0–52.0)
Hemoglobin: 14.5 g/dL (ref 13.0–17.0)
Immature Granulocytes: 0 %
Lymphocytes Relative: 16 %
Lymphs Abs: 0.5 10*3/uL — ABNORMAL LOW (ref 0.7–4.0)
MCH: 29.3 pg (ref 26.0–34.0)
MCHC: 34.5 g/dL (ref 30.0–36.0)
MCV: 84.8 fL (ref 80.0–100.0)
Monocytes Absolute: 0.3 10*3/uL (ref 0.1–1.0)
Monocytes Relative: 9 %
Neutro Abs: 2.4 10*3/uL (ref 1.7–7.7)
Neutrophils Relative %: 75 %
Platelets: 194 10*3/uL (ref 150–400)
RBC: 4.95 MIL/uL (ref 4.22–5.81)
RDW: 13.3 % (ref 11.5–15.5)
WBC: 3.3 10*3/uL — ABNORMAL LOW (ref 4.0–10.5)
nRBC: 0 % (ref 0.0–0.2)

## 2020-11-20 LAB — BASIC METABOLIC PANEL
Anion gap: 13 (ref 5–15)
BUN: 23 mg/dL — ABNORMAL HIGH (ref 6–20)
CO2: 24 mmol/L (ref 22–32)
Calcium: 8.2 mg/dL — ABNORMAL LOW (ref 8.9–10.3)
Chloride: 93 mmol/L — ABNORMAL LOW (ref 98–111)
Creatinine, Ser: 1.29 mg/dL — ABNORMAL HIGH (ref 0.61–1.24)
GFR, Estimated: >60 mL/min (ref >60)
Glucose, Bld: 202 mg/dL — ABNORMAL HIGH (ref 70–99)
Potassium: 4.3 mmol/L (ref 3.5–5.1)
Sodium: 130 mmol/L — ABNORMAL LOW (ref 135–145)

## 2020-11-20 LAB — TROPONIN I (HIGH SENSITIVITY): Troponin I (High Sensitivity): 10 ng/L (ref <18)

## 2020-11-20 MED ORDER — ALBUTEROL SULFATE HFA 108 (90 BASE) MCG/ACT IN AERS: 2.0000 | INHALATION_SPRAY | RESPIRATORY_TRACT | Status: DC | PRN

## 2020-11-20 NOTE — ED Notes (Signed)
Patient placed in triage 3. Lab called to draw blood after unsuccessful attempts by this RN and Ariel NT. Kim states she'll send someone. Patient sitting comfortably in recliner at this time.

## 2020-11-20 NOTE — ED Triage Notes (Signed)
Pt arrives [facility] from home w cc of shob increased today. Tested covid positive Monday. Was RR 37 when EMS arrrived, 26 now resting 90-92% room air Hx diabetes, CBG 200 126/86, P 90 Rhonchi lower lobes, upper mostly clear. Productive cough w yellow/green sputum, taking mucinex. Pt states taking tylenol and motrin at home for fever.

## 2020-11-24 ENCOUNTER — Other Ambulatory Visit: Payer: Self-pay

## 2020-11-24 ENCOUNTER — Inpatient Hospital Stay
Admission: EM | Admit: 2020-11-24 | Discharge: 2020-11-29 | DRG: 177 | Disposition: A | Discharge status: Home / Self Care | Payer: BC Managed Care – PPO | Attending: Family Medicine | Admitting: Family Medicine

## 2020-11-24 ENCOUNTER — Emergency Department: Payer: BC Managed Care – PPO

## 2020-11-24 DIAGNOSIS — E8881 Metabolic syndrome: Secondary | ICD-10-CM | POA: Diagnosis not present

## 2020-11-24 DIAGNOSIS — E119 Type 2 diabetes mellitus without complications: Secondary | ICD-10-CM | POA: Diagnosis not present

## 2020-11-24 DIAGNOSIS — E669 Obesity, unspecified: Secondary | ICD-10-CM | POA: Diagnosis not present

## 2020-11-24 DIAGNOSIS — H9190 Unspecified hearing loss, unspecified ear: Secondary | ICD-10-CM | POA: Diagnosis present

## 2020-11-24 DIAGNOSIS — Z79899 Other long term (current) drug therapy: Secondary | ICD-10-CM

## 2020-11-24 DIAGNOSIS — I10 Essential (primary) hypertension: Secondary | ICD-10-CM | POA: Diagnosis present

## 2020-11-24 DIAGNOSIS — Z6836 Body mass index (BMI) 36.0-36.9, adult: Secondary | ICD-10-CM

## 2020-11-24 DIAGNOSIS — Z7984 Long term (current) use of oral hypoglycemic drugs: Secondary | ICD-10-CM

## 2020-11-24 DIAGNOSIS — E785 Hyperlipidemia, unspecified: Secondary | ICD-10-CM | POA: Diagnosis present

## 2020-11-24 DIAGNOSIS — U071 COVID-19: Principal | ICD-10-CM

## 2020-11-24 DIAGNOSIS — E860 Dehydration: Secondary | ICD-10-CM | POA: Diagnosis present

## 2020-11-24 DIAGNOSIS — J1282 Pneumonia due to coronavirus disease 2019: Secondary | ICD-10-CM | POA: Diagnosis present

## 2020-11-24 DIAGNOSIS — J9601 Acute respiratory failure with hypoxia: Secondary | ICD-10-CM | POA: Diagnosis present

## 2020-11-24 DIAGNOSIS — J9600 Acute respiratory failure, unspecified whether with hypoxia or hypercapnia: Secondary | ICD-10-CM | POA: Diagnosis not present

## 2020-11-24 DIAGNOSIS — K219 Gastro-esophageal reflux disease without esophagitis: Secondary | ICD-10-CM | POA: Diagnosis present

## 2020-11-24 DIAGNOSIS — Z7982 Long term (current) use of aspirin: Secondary | ICD-10-CM

## 2020-11-24 LAB — COMPREHENSIVE METABOLIC PANEL
ALT: 32 U/L (ref 0–44)
AST: 31 U/L (ref 15–41)
Albumin: 2.9 g/dL — ABNORMAL LOW (ref 3.5–5.0)
Alkaline Phosphatase: 45 U/L (ref 38–126)
Anion gap: 18 — ABNORMAL HIGH (ref 5–15)
BUN: 21 mg/dL — ABNORMAL HIGH (ref 6–20)
CO2: 19 mmol/L — ABNORMAL LOW (ref 22–32)
Calcium: 8.3 mg/dL — ABNORMAL LOW (ref 8.9–10.3)
Chloride: 94 mmol/L — ABNORMAL LOW (ref 98–111)
Creatinine, Ser: 0.98 mg/dL (ref 0.61–1.24)
GFR, Estimated: >60 mL/min (ref >60)
Glucose, Bld: 263 mg/dL — ABNORMAL HIGH (ref 70–99)
Potassium: 4.5 mmol/L (ref 3.5–5.1)
Sodium: 131 mmol/L — ABNORMAL LOW (ref 135–145)
Total Bilirubin: 1.3 mg/dL — ABNORMAL HIGH (ref 0.3–1.2)
Total Protein: 6.9 g/dL (ref 6.5–8.1)

## 2020-11-24 LAB — CBC WITH DIFFERENTIAL/PLATELET
Abs Immature Granulocytes: 0.1 10*3/uL — ABNORMAL HIGH (ref 0.00–0.07)
Basophils Absolute: 0 10*3/uL (ref 0.0–0.1)
Basophils Relative: 0 %
Eosinophils Absolute: 0 10*3/uL (ref 0.0–0.5)
Eosinophils Relative: 0 %
HCT: 36.9 % — ABNORMAL LOW (ref 39.0–52.0)
Hemoglobin: 12.6 g/dL — ABNORMAL LOW (ref 13.0–17.0)
Immature Granulocytes: 1 %
Lymphocytes Relative: 7 %
Lymphs Abs: 0.6 10*3/uL — ABNORMAL LOW (ref 0.7–4.0)
MCH: 29 pg (ref 26.0–34.0)
MCHC: 34.1 g/dL (ref 30.0–36.0)
MCV: 85 fL (ref 80.0–100.0)
Monocytes Absolute: 0.6 10*3/uL (ref 0.1–1.0)
Monocytes Relative: 7 %
Neutro Abs: 6.7 10*3/uL (ref 1.7–7.7)
Neutrophils Relative %: 85 %
Platelets: 334 10*3/uL (ref 150–400)
RBC: 4.34 MIL/uL (ref 4.22–5.81)
RDW: 13.2 % (ref 11.5–15.5)
WBC: 8 10*3/uL (ref 4.0–10.5)
nRBC: 0 % (ref 0.0–0.2)

## 2020-11-24 LAB — LACTIC ACID, PLASMA
Lactic Acid, Venous: 1.5 mmol/L (ref 0.5–1.9)
Lactic Acid, Venous: 1.7 mmol/L (ref 0.5–1.9)

## 2020-11-24 LAB — URINALYSIS, COMPLETE (UACMP) WITH MICROSCOPIC
Bacteria, UA: NONE SEEN
Bilirubin Urine: NEGATIVE
Glucose, UA: >=500 mg/dL — AB
Ketones, ur: 80 mg/dL — AB
Leukocytes,Ua: NEGATIVE
Nitrite: NEGATIVE
Protein, ur: NEGATIVE mg/dL
Specific Gravity, Urine: 1.025 (ref 1.005–1.030)
Squamous Epithelial / HPF: NONE SEEN (ref 0–5)
pH: 5 (ref 5.0–8.0)

## 2020-11-24 LAB — FIBRIN DERIVATIVES D-DIMER (ARMC ONLY): Fibrin derivatives D-dimer (ARMC): 1494.87 ng/mL (FEU) — ABNORMAL HIGH (ref 0.00–499.00)

## 2020-11-24 LAB — PROTIME-INR
INR: 1 (ref 0.8–1.2)
Prothrombin Time: 12.4 seconds (ref 11.4–15.2)

## 2020-11-24 LAB — PROCALCITONIN: Procalcitonin: 0.22 ng/mL

## 2020-11-24 MED ORDER — ALBUTEROL SULFATE HFA 108 (90 BASE) MCG/ACT IN AERS
2.0000 | INHALATION_SPRAY | RESPIRATORY_TRACT | Status: AC
  Administered 2020-11-25 – 2020-11-26 (×8): 2 via RESPIRATORY_TRACT
  Filled 2020-11-24: qty 6.7

## 2020-11-24 MED ORDER — SODIUM CHLORIDE 0.9 % IV BOLUS
1000.0000 mL | Freq: Once | INTRAVENOUS | Status: AC
  Administered 2020-11-24: 1000 mL via INTRAVENOUS

## 2020-11-24 MED ORDER — METHYLPREDNISOLONE SODIUM SUCC 125 MG IJ SOLR
125.0000 mg | Freq: Once | INTRAMUSCULAR | Status: AC
  Administered 2020-11-24: 125 mg via INTRAVENOUS
  Filled 2020-11-24: qty 2

## 2020-11-24 MED ORDER — ACETAMINOPHEN 325 MG PO TABS: 325.0000 mg | ORAL_TABLET | Freq: Four times a day (QID) | ORAL | Status: DC | PRN

## 2020-11-24 MED ORDER — SIMVASTATIN 20 MG PO TABS
40.0000 mg | ORAL_TABLET | Freq: Every evening | ORAL | Status: DC
  Administered 2020-11-24 – 2020-11-28 (×5): 40 mg via ORAL
  Filled 2020-11-24: qty 2
  Filled 2020-11-24: qty 4
  Filled 2020-11-24 (×2): qty 2

## 2020-11-24 MED ORDER — ZINC SULFATE 220 (50 ZN) MG PO CAPS
220.0000 mg | ORAL_CAPSULE | Freq: Every day | ORAL | Status: DC
  Administered 2020-11-25 – 2020-11-28 (×4): 220 mg via ORAL
  Filled 2020-11-24 (×4): qty 1

## 2020-11-24 MED ORDER — METHYLPREDNISOLONE SODIUM SUCC 125 MG IJ SOLR
1.0000 mg/kg | Freq: Two times a day (BID) | INTRAMUSCULAR | Status: AC
  Administered 2020-11-25 – 2020-11-27 (×6): 111.25 mg via INTRAVENOUS
  Filled 2020-11-24 (×6): qty 2

## 2020-11-24 MED ORDER — ENOXAPARIN SODIUM 60 MG/0.6ML ~~LOC~~ SOLN
0.5000 mg/kg | SUBCUTANEOUS | Status: DC
  Administered 2020-11-24 – 2020-11-28 (×5): 55 mg via SUBCUTANEOUS
  Filled 2020-11-24 (×4): qty 0.6

## 2020-11-24 MED ORDER — METFORMIN HCL 500 MG PO TABS: 1000.0000 mg | ORAL_TABLET | Freq: Two times a day (BID) | ORAL | Status: DC

## 2020-11-24 MED ORDER — ONDANSETRON HCL 4 MG PO TABS: 4.0000 mg | ORAL_TABLET | Freq: Four times a day (QID) | ORAL | Status: DC | PRN

## 2020-11-24 MED ORDER — ONDANSETRON HCL 4 MG/2ML IJ SOLN: 4.0000 mg | Freq: Four times a day (QID) | INTRAMUSCULAR | Status: DC | PRN

## 2020-11-24 MED ORDER — IOHEXOL 350 MG/ML SOLN
100.0000 mL | Freq: Once | INTRAVENOUS | Status: AC | PRN
  Administered 2020-11-24: 100 mL via INTRAVENOUS

## 2020-11-24 MED ORDER — PREDNISONE 50 MG PO TABS
50.0000 mg | ORAL_TABLET | Freq: Every day | ORAL | Status: DC
  Administered 2020-11-28 – 2020-11-29 (×2): 50 mg via ORAL
  Filled 2020-11-24 (×2): qty 1

## 2020-11-24 MED ORDER — SODIUM CHLORIDE 0.9 % IV SOLN
200.0000 mg | Freq: Once | INTRAVENOUS | Status: AC
  Administered 2020-11-24: 200 mg via INTRAVENOUS
  Filled 2020-11-24: qty 40

## 2020-11-24 MED ORDER — SODIUM CHLORIDE 0.9 % IV SOLN
100.0000 mg | Freq: Every day | INTRAVENOUS | Status: AC
  Administered 2020-11-25 – 2020-11-28 (×4): 100 mg via INTRAVENOUS
  Filled 2020-11-24 (×4): qty 20

## 2020-11-24 MED ORDER — GUAIFENESIN-DM 100-10 MG/5ML PO SYRP
10.0000 mL | ORAL_SOLUTION | ORAL | Status: DC | PRN
  Administered 2020-11-27: 05:00:00 10 mL via ORAL
  Filled 2020-11-24: qty 10

## 2020-11-24 MED ORDER — ASCORBIC ACID 500 MG PO TABS
500.0000 mg | ORAL_TABLET | Freq: Every day | ORAL | Status: DC
  Administered 2020-11-25 – 2020-11-28 (×4): 500 mg via ORAL
  Filled 2020-11-24 (×5): qty 1

## 2020-11-24 MED ORDER — METOPROLOL TARTRATE 50 MG PO TABS
50.0000 mg | ORAL_TABLET | Freq: Two times a day (BID) | ORAL | Status: DC
  Administered 2020-11-24 – 2020-11-28 (×8): 50 mg via ORAL
  Filled 2020-11-24 (×8): qty 1

## 2020-11-24 MED ORDER — HYDROCOD POLST-CPM POLST ER 10-8 MG/5ML PO SUER
5.0000 mL | Freq: Two times a day (BID) | ORAL | Status: DC | PRN
Start: 2020-11-24 — End: 2020-11-29
  Administered 2020-11-26: 5 mL via ORAL
  Filled 2020-11-24: qty 5

## 2020-11-24 MED ORDER — LISINOPRIL 20 MG PO TABS
40.0000 mg | ORAL_TABLET | Freq: Every day | ORAL | Status: DC
  Administered 2020-11-26 – 2020-11-28 (×3): 40 mg via ORAL
  Filled 2020-11-24 (×2): qty 4
  Filled 2020-11-24 (×2): qty 2

## 2020-11-24 NOTE — ED Provider Notes (Signed)
[facility] Emergency Department Provider Note  ____________________________________________   Event Date/Time   First MD Initiated Contact with Patient 11/24/20 1656     (approximate)  I have reviewed the triage vital signs and the nursing notes.   HISTORY  Chief Complaint Shortness of Breath and Covid Positive    HPI Johnny Barrera. is a 55 y.o. male with history of hypertension, hyperlipidemia, GERD, diabetes, here with shortness of breath.  The patient was diagnosed with Covid approximately 10 days ago.  He states he initially had fevers, shortness of breath, and general fatigue.  Since then, he has not necessarily felt better, but has acutely worsened over the last 24 hours.  He has had increasing shortness of breath.  He feels like he has had headache, lightheadedness, and confusion.  He was seen by his physician for this and has been on several days of prednisone.  No antibiotics.  He has not noticed any improvement with this.  On arrival, patient markedly hypoxic and placed on nonrebreather.  History is somewhat limited due to shortness of breath.        Past Medical History:  Diagnosis Date  . Diabetes mellitus without complication ([place])   . GERD (gastroesophageal reflux disease)   . Hypercholesterolemia   . Hypertension     Patient Active Problem List   Diagnosis Date Noted  . Septic shock ([place])   . Gastroenteritis, acute   . Sepsis ([place]) 10/21/2018    Past Surgical History:  Procedure Laterality Date  . COLONOSCOPY WITH PROPOFOL N/A 06/26/2016   Procedure: COLONOSCOPY WITH PROPOFOL;  Surgeon: Manya Silvas, MD;  Location: [facility] ENDOSCOPY;  Service: Endoscopy;  Laterality: N/A;    Prior to Admission medications   Medication Sig Start Date End Date Taking? Authorizing Provider  aspirin EC 81 MG tablet Take 81 mg by mouth daily.    [provider]  Fenofibrate 50 MG CAPS Take 50 mg by mouth daily.    [provider]  glipiZIDE (GLUCOTROL) 5 MG tablet Take 5 mg by mouth 2 (two) times daily before a meal.    [provider]  lisinopril (PRINIVIL,ZESTRIL) 40 MG tablet Take 40 mg by mouth daily.    [provider]  metFORMIN (GLUCOPHAGE) 1000 MG tablet Take 1,000 mg by mouth 2 (two) times daily. 06/25/17   [provider]  metoprolol tartrate (LOPRESSOR) 50 MG tablet Take 50 mg by mouth 2 (two) times daily.    [provider]  Multiple Vitamins-Minerals (MULTIVITAMIN PO) Take 1 tablet by mouth daily.    [provider]  naproxen sodium (ALEVE) 220 MG tablet Take 220 mg by mouth 2 (two) times daily as needed (for pain or headache).    [provider]  simvastatin (ZOCOR) 40 MG tablet Take 40 mg by mouth every evening.     [provider]    Allergies Patient has no known allergies.  History reviewed. No pertinent family history.  Social History Social History   Tobacco Use  . Smoking status: Never Smoker  . Smokeless tobacco: Never Used  Substance Use Topics  . Alcohol use: Not Currently  . Drug use: Not Currently    Review of Systems  Review of Systems  Constitutional: Positive for chills, fatigue and fever.  HENT: Negative for sore throat.   Respiratory: Positive for cough and shortness of breath.   Cardiovascular: Negative for chest pain.  Gastrointestinal: Positive for diarrhea, nausea and vomiting. Negative for  abdominal pain.  Genitourinary: Negative for flank pain.  Musculoskeletal: Negative for neck pain.  Skin: Negative for rash and wound.  Allergic/Immunologic: Negative for immunocompromised state.  Neurological: Positive for weakness. Negative for numbness.  Hematological: Does not bruise/bleed easily.  All other systems reviewed and are negative.    ____________________________________________  PHYSICAL EXAM:      VITAL SIGNS: ED Triage Vitals  Enc Vitals Group     BP 11/24/20 1658 129/74     Pulse Rate  11/24/20 1658 (!) 108     Resp 11/24/20 1658 (!) 22     Temp 11/24/20 1658 98.8 F (37.1 C)     Temp Source 11/24/20 1658 Oral     SpO2 11/24/20 1658 98 %     Weight 11/24/20 1657 245 lb (111.1 kg)     Height 11/24/20 1657 5\' 9"  (1.753 m)     Head Circumference --      Peak Flow --      Pain Score 11/24/20 1656 0     Pain Loc --      Pain Edu? --      Excl. in [place]? --      Physical Exam Vitals and nursing note reviewed.  Constitutional:      General: He is not in acute distress.    Appearance: He is well-developed.  HENT:     Head: Normocephalic and atraumatic.  Eyes:     Conjunctiva/sclera: Conjunctivae normal.  Cardiovascular:     Rate and Rhythm: Regular rhythm. Tachycardia present.     Heart sounds: Normal heart sounds. No murmur heard. No friction rub.  Pulmonary:     Effort: Pulmonary effort is normal. Tachypnea present. No respiratory distress.     Breath sounds: Examination of the right-upper field reveals rales. Examination of the left-upper field reveals rales. Examination of the right-middle field reveals rales. Examination of the left-middle field reveals rales. Examination of the right-lower field reveals rales. Examination of the left-lower field reveals rales. Wheezing and rales present.  Abdominal:     General: There is no distension.     Palpations: Abdomen is soft.     Tenderness: There is no abdominal tenderness.  Musculoskeletal:     Cervical back: Neck supple.  Skin:    General: Skin is warm.     Capillary Refill: Capillary refill takes less than 2 seconds.  Neurological:     Mental Status: He is alert and oriented to person, place, and time.     Motor: No abnormal muscle tone.       ____________________________________________   LABS (all labs ordered are listed, but only abnormal results are displayed)  Labs Reviewed  COMPREHENSIVE METABOLIC PANEL - Abnormal; Notable for the following components:      Result Value   Sodium 131 (*)     Chloride 94 (*)    CO2 19 (*)    Glucose, Bld 263 (*)    BUN 21 (*)    Calcium 8.3 (*)    Albumin 2.9 (*)    Total Bilirubin 1.3 (*)    Anion gap 18 (*)    All other components within normal limits  CBC WITH DIFFERENTIAL/PLATELET - Abnormal; Notable for the following components:   Hemoglobin 12.6 (*)    HCT 36.9 (*)    Lymphs Abs 0.6 (*)    Abs Immature Granulocytes 0.10 (*)    All other components within normal limits  FIBRIN DERIVATIVES D-DIMER ([facility] ONLY) - Abnormal; Notable for the following  components:   Fibrin derivatives D-dimer [facility]) 1,494.87 (*)    All other components within normal limits  CULTURE, BLOOD (ROUTINE X 2)  CULTURE, BLOOD (ROUTINE X 2)  LACTIC ACID, PLASMA  PROTIME-INR  PROCALCITONIN  LACTIC ACID, PLASMA  URINALYSIS, COMPLETE (UACMP) WITH MICROSCOPIC    ____________________________________________  EKG: Sinus tachycardia, ventricular rate 105.  PR 136, QRS 89, QTc 446.  No acute ST elevation or depression.  No acute events of acute ischemia or infarct. ________________________________________  RADIOLOGY All imaging, including plain films, CT scans, and ultrasounds, independently reviewed by me, and interpretations confirmed via formal radiology reads.  ED MD interpretation:   Chest x-ray: Multifocal pneumonia consistent with COVID-19  Official radiology report(s): DG Chest Portable 1 View  Result Date: 11/24/2020 CLINICAL DATA:  COVID positive, hypoxia EXAM: PORTABLE CHEST 1 VIEW COMPARISON:  Chest radiograph dated 11/20/2020 FINDINGS: The heart remains enlarged. Moderate to severe diffuse bilateral interstitial and airspace opacities appear increased since prior exam. There is no pleural effusion or pneumothorax. The osseous structures are intact. IMPRESSION: Moderate to severe diffuse bilateral interstitial and airspace opacities appear increased since prior exam, consistent with COVID-19 pneumonia. Electronically Signed   By: Zerita Boers M.D.   On:  11/24/2020 18:34    ____________________________________________  PROCEDURES   Procedure(s) performed (including Critical Care):  .1-3 Lead EKG Interpretation Performed by: Duffy Bruce, MD Authorized by: Duffy Bruce, MD     Interpretation: normal     ECG rate:  80-100   ECG rate assessment: normal     Rhythm: sinus rhythm     Ectopy: none     Conduction: normal   Comments:     Indication: resp failure .Critical Care Performed by: Duffy Bruce, MD Authorized by: Duffy Bruce, MD   Critical care provider statement:    Critical care time (minutes):  35   Critical care time was exclusive of:  Separately billable procedures and treating other patients and teaching time   Critical care was necessary to treat or prevent imminent or life-threatening deterioration of the following conditions:  Cardiac failure, circulatory failure and respiratory failure   Critical care was time spent personally by me on the following activities:  Development of treatment plan with patient or surrogate, discussions with consultants, evaluation of patient's response to treatment, examination of patient, obtaining history from patient or surrogate, ordering and performing treatments and interventions, ordering and review of laboratory studies, ordering and review of radiographic studies, pulse oximetry, re-evaluation of patient's condition and review of old charts   I assumed direction of critical care for this patient from another provider in my specialty: no      ____________________________________________  INITIAL IMPRESSION / MDM / [place] / ED COURSE  As part of my medical decision making, I reviewed the following data within the [place] notes reviewed and incorporated, Old chart reviewed, Notes from prior ED visits, and [place] Controlled Substance [place]. was evaluated in Emergency Department on 11/24/2020 for the  symptoms described in the history of present illness. He was evaluated in the context of the global COVID-19 pandemic, which necessitated consideration that the patient might be at risk for infection with the SARS-CoV-2 virus that causes COVID-19. Institutional protocols and algorithms that pertain to the evaluation of patients at risk for COVID-19 are in a state of rapid change based on information released by regulatory bodies including the CDC and federal and state organizations.  These policies and algorithms were followed during the patient's care in the ED.  Some ED evaluations and interventions may be delayed as a result of limited staffing during the pandemic.*     Medical Decision Making:  55 yo M here with cough, SOB, hypoxia in setting of COVID-19. Pt on 15L NRB on arrival, but nontoxic and speaking in full sentences. Labs show normal WBC. Hgb 12.6. CO2 19, BUN 21 likely related to mild dehydration. CXR shows multifocal PNA c/w COVID-19. LA normal, procal <0.25 - doubt superimposed infection. D-Dimer markedly elevated so CT ordered. Pt started on IV steroids, O2, remdesevir.  ____________________________________________  FINAL CLINICAL IMPRESSION(S) / ED DIAGNOSES  Final diagnoses:  Acute respiratory failure due to COVID-19 [facility])     MEDICATIONS GIVEN DURING THIS VISIT:  Medications  remdesivir 200 mg in sodium chloride 0.9% 250 mL IVPB (200 mg Intravenous New Bag/Given 11/24/20 1837)    Followed by  remdesivir 100 mg in sodium chloride 0.9 % 100 mL IVPB (has no administration in time range)  methylPREDNISolone sodium succinate (SOLU-MEDROL) 125 mg/2 mL injection 125 mg (125 mg Intravenous Given 11/24/20 1742)  sodium chloride 0.9 % bolus 1,000 mL (1,000 mLs Intravenous New Bag/Given 11/24/20 1836)     ED Discharge Orders    None       Note:  This document was prepared using Dragon voice recognition software and may include unintentional dictation errors.   Duffy Bruce,  MD 11/24/20 [phone]

## 2020-11-24 NOTE — ED Notes (Signed)
Pharmacy messaged again through [facility] for missing inhaler.

## 2020-11-24 NOTE — ED Notes (Signed)
X-ray at bedside

## 2020-11-24 NOTE — ED Triage Notes (Signed)
Pt arrives to ER via [facility] from home for hypoxia from [place]. Dx Monday, dx COVID pneumonia Thursday. 84% RA with EMS with cyanosis. Placed on nonrebreather at 15 L and satting 94% upon arrival. Grunting noted. Labored breathing. Able to talk in short sentences.

## 2020-11-24 NOTE — Progress Notes (Signed)
PHARMACIST - PHYSICIAN COMMUNICATION  CONCERNING:  Enoxaparin (Lovenox) for DVT Prophylaxis    RECOMMENDATION: Patient was prescribed enoxaprin 40mg  q24 hours for VTE prophylaxis.   Filed Weights   11/24/20 1657  Weight: 111.1 kg (245 lb)    Body mass index is 36.18 kg/m.  Estimated Creatinine Clearance: 107.1 mL/min (by C-G formula based on SCr of 0.98 mg/dL).   Based on [place] patient is candidate for enoxaparin 0.5mg /kg TBW SQ every 24 hours based on BMI being >30.   DESCRIPTION: Pharmacy has adjusted enoxaparin dose per [facility] policy.  Patient is now receiving enoxaparin 55 mg every 24 hours    Dorothe Pea, PharmD Clinical Pharmacist  11/24/2020 7:18 PM

## 2020-11-24 NOTE — ED Notes (Signed)
EDP at bedside  

## 2020-11-24 NOTE — ED Notes (Signed)
Message sent to pharmacy through [facility] to send missing inhaler.

## 2020-11-24 NOTE — Consult Note (Signed)
Remdesivir - Pharmacy Brief Note   O:  SpO2: 98% on 15 L NR   A/P:  Remdesivir 200 mg IVPB once followed by 100 mg IVPB daily x 4 days.   Dorothe Pea, PharmD, BCPS Clinical Pharmacist  11/24/2020 6:02 PM

## 2020-11-24 NOTE — H&P (Addendum)
History and Physical   Johnny Barrera. [id number] DOB: Apr 11, 1966 DOA: 11/24/2020  PCP: Baxter Hire, MD  Patient coming from: Home via EMS  I have personally briefly reviewed patient's old medical records in [place].  Chief Concern: Shortness of breath  HPI: Johnny Elbaz. is a 55 y.o. male with medical history significant for hypertension, obesity, hearing loss, non-insulin-dependent diabetes mellitus 2, hyperlipidemia, GERD, presents to the emergency department for chief concerns of worsening shortness of breath.  He reports he has been having shortness of breath since 11/18/2020.  He was diagnosed with COVID-19 on 11/20/2020. He endorses fever, tmax of 102 on and off for 2 weeks.  He states that his fevers improved with Tylenol. He also endorses generalized weakness and fatigue.  He called EMS and was found to be hypoxic.  Per chart review, patient was saturating at 84% on room air with cyanosis.  EMS placed patient on 15 L nonrebreather and his SPO2 improved to 94%.  In the emergency department, patient had labored breathing and grunting was noted with speaking.  He was only able to speak in short sentences.  At bedside, patient was improved and can speak to me in complete full sentences.  He does have hearing loss and therefore he was very difficult for him to hear me.  He states that the shortness of breath was worse with exertion.  He reports that at this time on the high flow nasal cannula he has improved shortness of breath.  Furthermore he also endorses cough.  Social history: He lives with girlfriend.  He endorses that he quit drinking etoh about 5 years ago.  He denies history of smoking and recreational drug use  Vaccination history: Patient is unvaccinated for COVID-19  ROS: Constitutional: no weight change, n+ fever ENT/Mouth: no sore throat, no rhinorrhea Eyes: no eye pain, no vision changes Cardiovascular: no chest pain, + dyspnea,  no edema, no  palpitations Respiratory: + cough, no sputum, no wheezing Gastrointestinal: no nausea, no vomiting, no diarrhea, no constipation Genitourinary: no urinary incontinence, no dysuria, no hematuria Musculoskeletal: no arthralgias, + myalgias Skin: no skin lesions, no pruritus, Neuro: + weakness, no loss of consciousness, no syncope Psych: no anxiety, no depression, + decrease appetite Heme/Lymph: no bruising, no bleeding  ED Course: Discussed with ED provider, patient requiring hospitalization due to worsening shortness of breath on 15 L nasal cannula.  Assessment/Plan  Active Problems:   Acute hypoxemic respiratory failure due to COVID-19 [facility])   Acute hypoxemic respiratory failure secondary to COVID-19 infection - IV remdesivir per pharmacy, IV solumedrol 1 g/kg IV q12h initiated  - Procalcitonin was 0.22, no antibacterial coverage for superimposed bacterial pneumonia at this time - Incentive spirometry and flutter valve for 10 reps every 2 hours while awake - Albuterol inhaler 2 puffs every 4 hours while awake - Daily labs: CMP, CBC, CRP, D-dimer - Supplemental oxygen to maintain SPO2 goal of greater than 88% - Airborne and contact precautions - Admit to stepdown inpatient with telemetry cardiac monitoring for 48 hours  Hypertension-resumed home lisinopril 40 mg daily, metoprolol tartrate 50 mg twice daily  Non-insulin-dependent diabetes mellitus-resumed Metformin 1000 mg p.o. twice daily  Hyperlipidemia-resumed home simvastatin 40 mg nightly  As needed medications: Ondansetron, acetaminophen, Tussionex, Robitussin-DM  Chart reviewed.   (ABNORMAL) [facility] Covid 19 Antigen POCT (11/18/2020 10:28 AM EST)  DVT prophylaxis: Enoxaparin Code Status: Full code Diet: Heart healthy/carb modified Family Communication: No Disposition Plan: Pending clinical course Consults called:  None at this time Admission status: Inpatient with telemetry to stepdown  Past Medical History:   Diagnosis Date  . Diabetes mellitus without complication ([place])   . GERD (gastroesophageal reflux disease)   . Hypercholesterolemia   . Hypertension    Past Surgical History:  Procedure Laterality Date  . COLONOSCOPY WITH PROPOFOL N/A 06/26/2016   Procedure: COLONOSCOPY WITH PROPOFOL;  Surgeon: Manya Silvas, MD;  Location: [facility] ENDOSCOPY;  Service: Endoscopy;  Laterality: N/A;   Social History:  reports that he has never smoked. He has never used smokeless tobacco. He reports previous alcohol use. He reports previous drug use.  No Known Allergies History reviewed. No pertinent family history. Family history: Family history reviewed and not pertinent  Prior to Admission medications   Medication Sig Start Date End Date Taking? Authorizing Provider  aspirin EC 81 MG tablet Take 81 mg by mouth daily.    [provider]  Fenofibrate 50 MG CAPS Take 50 mg by mouth daily.    [provider]  glipiZIDE (GLUCOTROL) 5 MG tablet Take 5 mg by mouth 2 (two) times daily before a meal.    [provider]  lisinopril (PRINIVIL,ZESTRIL) 40 MG tablet Take 40 mg by mouth daily.    [provider]  metFORMIN (GLUCOPHAGE) 1000 MG tablet Take 1,000 mg by mouth 2 (two) times daily. 06/25/17   [provider]  metoprolol tartrate (LOPRESSOR) 50 MG tablet Take 50 mg by mouth 2 (two) times daily.    [provider]  Multiple Vitamins-Minerals (MULTIVITAMIN PO) Take 1 tablet by mouth daily.    [provider]  naproxen sodium (ALEVE) 220 MG tablet Take 220 mg by mouth 2 (two) times daily as needed (for pain or headache).    [provider]  simvastatin (ZOCOR) 40 MG tablet Take 40 mg by mouth every evening.     [provider]   Physical Exam: Vitals:   11/24/20 1658 11/24/20 1730 11/24/20 1800 11/24/20 1830  BP: 129/74 130/71 125/73 113/66  Pulse: (!) 108 (!) 103 99 93  Resp: (!) 22 (!) 30 (!) 32 (!) 30  Temp: 98.8 F (37.1  C)     TempSrc: Oral     SpO2: 98% 94% 93% 96%  Weight:      Height:       Constitutional: appears older than chronological age, NAD, calm, comfortable Eyes: PERRL, lids and conjunctivae normal ENMT: Mucous membranes are moist. Posterior pharynx clear of any exudate or lesions. Age-appropriate dentition.  Moderate hearing loss at baseline Neck: normal, supple, no masses, no thyromegaly Respiratory: clear to auscultation bilaterally, no wheezing, no crackles. Normal respiratory effort. No accessory muscle use.  Cardiovascular: Regular rate and rhythm, no murmurs / rubs / gallops. No extremity edema. 2+ pedal pulses. No carotid bruits.  Abdomen: no tenderness, no masses palpated, no hepatosplenomegaly. Bowel sounds positive.  Musculoskeletal: no clubbing / cyanosis. No joint deformity upper and lower extremities. Good ROM, no contractures, no atrophy. Normal muscle tone.  Skin: no rashes, lesions, ulcers. No induration Neurologic: Sensation intact. Strength 5/5 in all 4.  Psychiatric: Normal judgment and insight. Alert and oriented x 3. Normal mood.   EKG: independently reviewed, showing sinus tachycardia 105, QTc 446  Chest x-ray on Admission: I personally reviewed and I agree with radiologist reading as below.  DG Chest Portable 1 View  Result Date: 11/24/2020 CLINICAL DATA:  COVID positive, hypoxia EXAM: PORTABLE CHEST 1 VIEW COMPARISON:  Chest radiograph dated 11/20/2020 FINDINGS: The  heart remains enlarged. Moderate to severe diffuse bilateral interstitial and airspace opacities appear increased since prior exam. There is no pleural effusion or pneumothorax. The osseous structures are intact. IMPRESSION: Moderate to severe diffuse bilateral interstitial and airspace opacities appear increased since prior exam, consistent with COVID-19 pneumonia. Electronically Signed   By: Zerita Boers M.D.   On: 11/24/2020 18:34   Labs on Admission: I have personally reviewed following  labs  CBC: Recent Labs  Lab 11/20/20 2159 11/24/20 1711  WBC 3.3* 8.0  NEUTROABS 2.4 6.7  HGB 14.5 12.6*  HCT 42.0 36.9*  MCV 84.8 85.0  PLT 194 341   Basic Metabolic Panel: Recent Labs  Lab 11/20/20 2159 11/24/20 1711  NA 130* 131*  K 4.3 4.5  CL 93* 94*  CO2 24 19*  GLUCOSE 202* 263*  BUN 23* 21*  CREATININE 1.29* 0.98  CALCIUM 8.2* 8.3*   GFR: Estimated Creatinine Clearance: 107.1 mL/min (by C-G formula based on SCr of 0.98 mg/dL).  Liver Function Tests: Recent Labs  Lab 11/24/20 1711  AST 31  ALT 32  ALKPHOS 45  BILITOT 1.3*  PROT 6.9  ALBUMIN 2.9*   Coagulation Profile: Recent Labs  Lab 11/24/20 1711  INR 1.0   Urine analysis:    Component Value Date/Time   COLORURINE YELLOW (A) 11/24/2020 1711   APPEARANCEUR CLEAR (A) 11/24/2020 1711   LABSPEC 1.025 11/24/2020 1711   PHURINE 5.0 11/24/2020 1711   GLUCOSEU >=500 (A) 11/24/2020 1711   HGBUR SMALL (A) 11/24/2020 1711   BILIRUBINUR NEGATIVE 11/24/2020 1711   KETONESUR 80 (A) 11/24/2020 1711   PROTEINUR NEGATIVE 11/24/2020 1711   NITRITE NEGATIVE 11/24/2020 1711   LEUKOCYTESUR NEGATIVE 11/24/2020 1711   CRITICAL CARE Performed by: Briant Cedar Garrett Mitchum  Total critical care time: 40 minutes  Critical care time was exclusive of separately billable procedures and treating other patients.  Critical care was necessary to treat or prevent imminent or life-threatening deterioration. Respiratory failure  Critical care was time spent personally by me on the following activities: development of treatment plan with patient and/or surrogate as well as nursing, discussions with consultants, evaluation of patient's response to treatment, examination of patient, obtaining history from patient or surrogate, ordering and performing treatments and interventions, ordering and review of laboratory studies, ordering and review of radiographic studies, pulse oximetry and re-evaluation of patient's condition.  Koreena Joost N Cheridan Kibler  D.O. [facility]  If 7PM-7AM, please contact overnight-coverage provider If 7AM-7PM, please contact day coverage provider [email or web address]  11/24/2020, 7:12 PM

## 2020-11-25 ENCOUNTER — Other Ambulatory Visit: Payer: Self-pay

## 2020-11-25 DIAGNOSIS — J9600 Acute respiratory failure, unspecified whether with hypoxia or hypercapnia: Secondary | ICD-10-CM

## 2020-11-25 LAB — COMPREHENSIVE METABOLIC PANEL
ALT: 27 U/L (ref 0–44)
AST: 21 U/L (ref 15–41)
Albumin: 2.7 g/dL — ABNORMAL LOW (ref 3.5–5.0)
Alkaline Phosphatase: 41 U/L (ref 38–126)
Anion gap: 16 — ABNORMAL HIGH (ref 5–15)
BUN: 25 mg/dL — ABNORMAL HIGH (ref 6–20)
CO2: 17 mmol/L — ABNORMAL LOW (ref 22–32)
Calcium: 8.4 mg/dL — ABNORMAL LOW (ref 8.9–10.3)
Chloride: 100 mmol/L (ref 98–111)
Creatinine, Ser: 1.05 mg/dL (ref 0.61–1.24)
GFR, Estimated: >60 mL/min (ref >60)
Glucose, Bld: 287 mg/dL — ABNORMAL HIGH (ref 70–99)
Potassium: 4.7 mmol/L (ref 3.5–5.1)
Sodium: 133 mmol/L — ABNORMAL LOW (ref 135–145)
Total Bilirubin: 1.2 mg/dL (ref 0.3–1.2)
Total Protein: 6.6 g/dL (ref 6.5–8.1)

## 2020-11-25 LAB — CBC WITH DIFFERENTIAL/PLATELET
Abs Immature Granulocytes: 0.08 10*3/uL — ABNORMAL HIGH (ref 0.00–0.07)
Basophils Absolute: 0 10*3/uL (ref 0.0–0.1)
Basophils Relative: 0 %
Eosinophils Absolute: 0 10*3/uL (ref 0.0–0.5)
Eosinophils Relative: 0 %
HCT: 36.4 % — ABNORMAL LOW (ref 39.0–52.0)
Hemoglobin: 12.3 g/dL — ABNORMAL LOW (ref 13.0–17.0)
Immature Granulocytes: 2 %
Lymphocytes Relative: 9 %
Lymphs Abs: 0.5 10*3/uL — ABNORMAL LOW (ref 0.7–4.0)
MCH: 28.8 pg (ref 26.0–34.0)
MCHC: 33.8 g/dL (ref 30.0–36.0)
MCV: 85.2 fL (ref 80.0–100.0)
Monocytes Absolute: 0.2 10*3/uL (ref 0.1–1.0)
Monocytes Relative: 4 %
Neutro Abs: 4.7 10*3/uL (ref 1.7–7.7)
Neutrophils Relative %: 85 %
Platelets: 346 10*3/uL (ref 150–400)
RBC: 4.27 MIL/uL (ref 4.22–5.81)
RDW: 13.2 % (ref 11.5–15.5)
WBC: 5.5 10*3/uL (ref 4.0–10.5)
nRBC: 0 % (ref 0.0–0.2)

## 2020-11-25 LAB — HIV ANTIBODY (ROUTINE TESTING W REFLEX): HIV Screen 4th Generation wRfx: NONREACTIVE

## 2020-11-25 LAB — GLUCOSE, RANDOM: Glucose, Bld: 390 mg/dL — ABNORMAL HIGH (ref 70–99)

## 2020-11-25 LAB — FIBRIN DERIVATIVES D-DIMER (ARMC ONLY): Fibrin derivatives D-dimer (ARMC): 1232.02 ng/mL (FEU) — ABNORMAL HIGH (ref 0.00–499.00)

## 2020-11-25 LAB — CBG MONITORING, ED: Glucose-Capillary: 525 mg/dL (ref 70–99)

## 2020-11-25 LAB — C-REACTIVE PROTEIN: CRP: 19.8 mg/dL — ABNORMAL HIGH (ref <1.0)

## 2020-11-25 MED ORDER — ACETAMINOPHEN 325 MG PO TABS
325.0000 mg | ORAL_TABLET | Freq: Four times a day (QID) | ORAL | Status: AC | PRN
  Filled 2020-11-25: qty 2

## 2020-11-25 MED ORDER — INSULIN ASPART 100 UNIT/ML ~~LOC~~ SOLN
0.0000 [IU] | Freq: Every day | SUBCUTANEOUS | Status: DC
  Administered 2020-11-25: 5 [IU] via SUBCUTANEOUS
  Administered 2020-11-26 – 2020-11-28 (×3): 4 [IU] via SUBCUTANEOUS
  Filled 2020-11-25 (×4): qty 1

## 2020-11-25 MED ORDER — ASPIRIN EC 81 MG PO TBEC
81.0000 mg | DELAYED_RELEASE_TABLET | Freq: Every day | ORAL | Status: DC
  Administered 2020-11-26 – 2020-11-28 (×3): 81 mg via ORAL
  Filled 2020-11-25 (×4): qty 1

## 2020-11-25 MED ORDER — LINAGLIPTIN 5 MG PO TABS
5.0000 mg | ORAL_TABLET | Freq: Every day | ORAL | Status: DC
  Administered 2020-11-26 – 2020-11-28 (×3): 5 mg via ORAL
  Filled 2020-11-25 (×5): qty 1

## 2020-11-25 MED ORDER — INSULIN ASPART 100 UNIT/ML ~~LOC~~ SOLN
0.0000 [IU] | Freq: Three times a day (TID) | SUBCUTANEOUS | Status: DC
  Administered 2020-11-26: 15 [IU] via SUBCUTANEOUS
  Administered 2020-11-26: 11 [IU] via SUBCUTANEOUS
  Administered 2020-11-26 – 2020-11-27 (×3): 15 [IU] via SUBCUTANEOUS
  Administered 2020-11-27: 09:00:00 20 [IU] via SUBCUTANEOUS
  Administered 2020-11-28: 15 [IU] via SUBCUTANEOUS
  Administered 2020-11-28: 13:00:00 11 [IU] via SUBCUTANEOUS
  Administered 2020-11-28: 15 [IU] via SUBCUTANEOUS
  Filled 2020-11-25 (×9): qty 1

## 2020-11-25 MED ORDER — BARICITINIB 2 MG PO TABS
4.0000 mg | ORAL_TABLET | Freq: Every day | ORAL | Status: DC
  Administered 2020-11-26 – 2020-11-28 (×3): 4 mg via ORAL
  Filled 2020-11-25 (×4): qty 2

## 2020-11-25 NOTE — ED Notes (Signed)
Lunch meal tray given.  

## 2020-11-25 NOTE — ED Notes (Signed)
Dinner meal tray given at this time.  

## 2020-11-25 NOTE — ED Notes (Signed)
Blood drawn and sent for confirmation of CBG 525,  MD aware.

## 2020-11-25 NOTE — Progress Notes (Signed)
Johnny Barrera.  GNF:[id number] DOB: 1966/02/25 DOA: 11/24/2020 PCP: Baxter Hire, MD    Brief Narrative:  55 year old with a history of HTN, hearing loss, obesity, DM 2, HLD, and GERD who presented to the [facility] ER with severe shortness of breath.  He was diagnosed with Covid 11/18/20.  Since the onset of his symptoms his shortness of breath has slowly but consistently worsened.  He called EMS and was found to be hypoxic with saturations 84% on room air.  Significant Events:  1/30 admit via [facility] ED  Date of Positive COVID Test:  11/18/2020  Vaccination Status: Unvaccinated  COVID-19 specific Treatment: Solu-Medrol 1/30 > Remdesivir 1/30 > Baricitinib 1/31 >  Antimicrobials:  None  DVT prophylaxis: Lovenox  Subjective: The patient tells me he still feels short of breath but that his dyspnea has improved since his presentation to the ED.  He denies chest pain nausea vomiting or abdominal pain.  I spoke to the patient about my desire to use the immunomodulators baricitinib given his high oxygen demand and high risk status for further decline.  He was educated that it was authorized under emergency use and counseled on its potential side effects.  He agreed with dosing.  Assessment & Plan:  COVID Pneumonia -acute hypoxic respiratory failure Continue Remdesivir and Solu-Medrol -baricitinib added 1/31 - no features to suggest bacterial component - requiring 15L HFNC at present -high risk for further decline  Recent Labs  Lab 11/24/20 1711 11/25/20 0555  CRP  --  19.8*  ALT 32 27  PROCALCITON 0.22  --     Elevated d-dimer No evidence of central PE on CTa chest - cont dvt prophy lovenox   HTN Blood pressure controlled at present  DM 2 Follow CBGs closely -SSI as needed  HLD Hold home medication until clear LFTs will not elevate on Remdesivir  Obesity  -Body mass index is 36.18 kg/m.   Code Status: FULL CODE Family Communication: no family present  Status is:  Inpatient  Remains inpatient appropriate because:Inpatient level of care appropriate due to severity of illness   Dispo: The patient is from: Home              Anticipated d/c is to: Home              Anticipated d/c date is: 3 days              Patient currently is not medically stable to d/c.   Difficult to place patient No   Consultants:  none  Objective: Blood pressure (!) 120/54, pulse 63, temperature 98 F (36.7 C), temperature source Axillary, resp. rate (!) 26, height 5\' 9"  (1.753 m), weight 111.1 kg, SpO2 98 %.  Intake/Output Summary (Last 24 hours) at 11/25/2020 1700 Last data filed at 11/25/2020 1536 Gross per 24 hour  Intake 100 ml  Output 3900 ml  Net -3800 ml   Filed Weights   11/24/20 1657  Weight: 111.1 kg    Examination: General: Not in extremis though requiring high-level oxygen support via salter nasal cannula Lungs: Distant breath sounds related to body habitus but fine crackles appreciable diffusely bilaterally with no wheezing Cardiovascular: Regular rate and rhythm without murmur gallop or rub normal S1 and S2 Abdomen: Nontender, nondistended, soft, bowel sounds positive, no rebound, no ascites, no appreciable mass -obese Extremities: No significant cyanosis, clubbing, or edema bilateral lower extremities  CBC: Recent Labs  Lab 11/20/20 2159 11/24/20 1711 11/25/20 0555  WBC 3.3* 8.0  5.5  NEUTROABS 2.4 6.7 4.7  HGB 14.5 12.6* 12.3*  HCT 42.0 36.9* 36.4*  MCV 84.8 85.0 85.2  PLT 194 334 973   Basic Metabolic Panel: Recent Labs  Lab 11/20/20 2159 11/24/20 1711 11/25/20 0555  NA 130* 131* 133*  K 4.3 4.5 4.7  CL 93* 94* 100  CO2 24 19* 17*  GLUCOSE 202* 263* 287*  BUN 23* 21* 25*  CREATININE 1.29* 0.98 1.05  CALCIUM 8.2* 8.3* 8.4*   GFR: Estimated Creatinine Clearance: 100 mL/min (by C-G formula based on SCr of 1.05 mg/dL).  Liver Function Tests: Recent Labs  Lab 11/24/20 1711 11/25/20 0555  AST 31 21  ALT 32 27  ALKPHOS 45  41  BILITOT 1.3* 1.2  PROT 6.9 6.6  ALBUMIN 2.9* 2.7*    Coagulation Profile: Recent Labs  Lab 11/24/20 1711  INR 1.0    HbA1C: No results found for: HGBA1C  CBG: No results for input(s): GLUCAP in the last 168 hours.  No results found for this or any previous visit (from the past 240 hour(s)).   Scheduled Meds: . albuterol  2 puff Inhalation Q4H while awake  . vitamin C  500 mg Oral Daily  . aspirin EC  81 mg Oral Daily  . baricitinib  4 mg Oral Daily  . enoxaparin (LOVENOX) injection  0.5 mg/kg Subcutaneous Q24H  . lisinopril  40 mg Oral Daily  . [START ON 11/26/2020] metFORMIN  1,000 mg Oral BID WC  . methylPREDNISolone (SOLU-MEDROL) injection  1 mg/kg Intravenous Q12H   Followed by  . [START ON 11/28/2020] predniSONE  50 mg Oral Daily  . metoprolol tartrate  50 mg Oral BID  . simvastatin  40 mg Oral QPM  . zinc sulfate  220 mg Oral Daily   Continuous Infusions: . remdesivir 100 mg in NS 100 mL Stopped (11/25/20 1147)     LOS: 1 day   Cherene Altes, MD [facility] Office  [phone] Pager - Text Page per Shea Evans  If 7PM-7AM, please contact night-coverage per Amion 11/25/2020, 5:00 PM

## 2020-11-25 NOTE — ED Notes (Signed)
Pt provided with crackers and iced water per request.

## 2020-11-25 NOTE — ED Notes (Signed)
Report received from [place], [place]. VSS. Pt is on 15L HFNC. Pt is A&Ox4 and NAD. Denies any needs at this point. Pt placed in hospital bed. Bed locked and in lowest position. Call bell within reach.

## 2020-11-26 DIAGNOSIS — J1282 Pneumonia due to coronavirus disease 2019: Secondary | ICD-10-CM

## 2020-11-26 DIAGNOSIS — U071 COVID-19: Secondary | ICD-10-CM | POA: Diagnosis present

## 2020-11-26 LAB — HEMOGLOBIN A1C
Hgb A1c MFr Bld: 9.9 % — ABNORMAL HIGH (ref 4.8–5.6)
Mean Plasma Glucose: 237.43 mg/dL

## 2020-11-26 LAB — COMPREHENSIVE METABOLIC PANEL
ALT: 31 U/L (ref 0–44)
AST: 18 U/L (ref 15–41)
Albumin: 3 g/dL — ABNORMAL LOW (ref 3.5–5.0)
Alkaline Phosphatase: 50 U/L (ref 38–126)
Anion gap: 21 — ABNORMAL HIGH (ref 5–15)
BUN: 38 mg/dL — ABNORMAL HIGH (ref 6–20)
CO2: 17 mmol/L — ABNORMAL LOW (ref 22–32)
Calcium: 9.1 mg/dL (ref 8.9–10.3)
Chloride: 99 mmol/L (ref 98–111)
Creatinine, Ser: 1.07 mg/dL (ref 0.61–1.24)
GFR, Estimated: >60 mL/min (ref >60)
Glucose, Bld: 335 mg/dL — ABNORMAL HIGH (ref 70–99)
Potassium: 5 mmol/L (ref 3.5–5.1)
Sodium: 137 mmol/L (ref 135–145)
Total Bilirubin: 1.3 mg/dL — ABNORMAL HIGH (ref 0.3–1.2)
Total Protein: 7.2 g/dL (ref 6.5–8.1)

## 2020-11-26 LAB — C-REACTIVE PROTEIN: CRP: 13.9 mg/dL — ABNORMAL HIGH (ref <1.0)

## 2020-11-26 LAB — CBC WITH DIFFERENTIAL/PLATELET
Abs Immature Granulocytes: 0.14 10*3/uL — ABNORMAL HIGH (ref 0.00–0.07)
Basophils Absolute: 0 10*3/uL (ref 0.0–0.1)
Basophils Relative: 0 %
Eosinophils Absolute: 0 10*3/uL (ref 0.0–0.5)
Eosinophils Relative: 0 %
HCT: 38.8 % — ABNORMAL LOW (ref 39.0–52.0)
Hemoglobin: 13.2 g/dL (ref 13.0–17.0)
Immature Granulocytes: 1 %
Lymphocytes Relative: 5 %
Lymphs Abs: 0.5 10*3/uL — ABNORMAL LOW (ref 0.7–4.0)
MCH: 28.8 pg (ref 26.0–34.0)
MCHC: 34 g/dL (ref 30.0–36.0)
MCV: 84.5 fL (ref 80.0–100.0)
Monocytes Absolute: 0.7 10*3/uL (ref 0.1–1.0)
Monocytes Relative: 7 %
Neutro Abs: 9.2 10*3/uL — ABNORMAL HIGH (ref 1.7–7.7)
Neutrophils Relative %: 87 %
Platelets: 460 10*3/uL — ABNORMAL HIGH (ref 150–400)
RBC: 4.59 MIL/uL (ref 4.22–5.81)
RDW: 13.2 % (ref 11.5–15.5)
WBC: 10.5 10*3/uL (ref 4.0–10.5)
nRBC: 0 % (ref 0.0–0.2)

## 2020-11-26 LAB — GLUCOSE, CAPILLARY
Glucose-Capillary: 302 mg/dL — ABNORMAL HIGH (ref 70–99)
Glucose-Capillary: 302 mg/dL — ABNORMAL HIGH (ref 70–99)

## 2020-11-26 LAB — CBG MONITORING, ED
Glucose-Capillary: 323 mg/dL — ABNORMAL HIGH (ref 70–99)
Glucose-Capillary: 289 mg/dL — ABNORMAL HIGH (ref 70–99)

## 2020-11-26 LAB — FIBRIN DERIVATIVES D-DIMER (ARMC ONLY): Fibrin derivatives D-dimer (ARMC): 1047.7 ng/mL (FEU) — ABNORMAL HIGH (ref 0.00–499.00)

## 2020-11-26 LAB — FERRITIN: Ferritin: 455 ng/mL — ABNORMAL HIGH (ref 24–336)

## 2020-11-26 MED ORDER — INSULIN ASPART 100 UNIT/ML ~~LOC~~ SOLN
4.0000 [IU] | Freq: Three times a day (TID) | SUBCUTANEOUS | Status: DC
  Administered 2020-11-26 – 2020-11-27 (×2): 4 [IU] via SUBCUTANEOUS
  Filled 2020-11-26 (×2): qty 1

## 2020-11-26 MED ORDER — INSULIN GLARGINE 100 UNIT/ML ~~LOC~~ SOLN
18.0000 [IU] | Freq: Every day | SUBCUTANEOUS | Status: DC
  Filled 2020-11-26 (×2): qty 0.18

## 2020-11-26 MED ORDER — INSULIN GLARGINE 100 UNIT/ML ~~LOC~~ SOLN: 15.0000 [IU] | Freq: Every day | SUBCUTANEOUS | Status: DC

## 2020-11-26 NOTE — Progress Notes (Signed)
[name].  WNU:[id number] DOB: 07-Apr-1966 DOA: 11/24/2020 PCP: Baxter Hire, MD    Brief Narrative:  55yo with a history of HTN, hearing loss, obesity, DM 2, HLD, and GERD who presented to the [facility] ER with severe shortness of breath.  He was diagnosed with Covid 11/18/20.  Since the onset of his symptoms his shortness of breath has slowly but consistently worsened.  He called EMS and was found to be hypoxic with saturations 84% on room air.  Significant Events:  1/30 admit via [facility] ED  Date of Positive COVID Test:  11/18/2020  Vaccination Status: Unvaccinated  COVID-19 specific Treatment: Solu-Medrol 1/30 > Remdesivir 1/30 > Baricitinib 2/1 (was ordered to begin 1/31) >  Antimicrobials:  None  DVT prophylaxis: Lovenox  Subjective: Vital signs stable.  Oxygen saturations 95-97% on 15 L salter high flow nasal cannula. Pt tells me he feels "about the same." Is HOH. Does not appear to be in acute distress. No chest pain, n/v, or abdom pain.   Assessment & Plan:  COVID Pneumonia - acute hypoxic respiratory failure Continue Remdesivir and Solu-Medrol -baricitinib added 1/31 but not given - confirmed w/ RN first dose to be given this AM - no features to suggest bacterial component - requiring 15L HFNC at present - high risk for further decline but does appear to be stabilizing to an extent at present   Recent Labs  Lab 11/24/20 1711 11/25/20 0555 11/26/20 0422  FERRITIN  --   --  455*  CRP  --  19.8* 13.9*  ALT 32 27 31  PROCALCITON 0.22  --   --     Elevated d-dimer No evidence of central PE on CTa chest - cont dvt prophy lovenox   HTN Blood pressure controlled at present  DM 2 - uncontrolled with hyperglycemia CBG significantly elevated - adjust insulin dosing in setting of high-dose steroids  HLD Hold home medication until clear LFTs will not elevate on Remdesivir  Obesity  -Body mass index is 36.18 kg/m.   Code Status: FULL CODE Family  Communication: no family present  Status is: Inpatient  Remains inpatient appropriate because:Inpatient level of care appropriate due to severity of illness   Dispo: The patient is from: Home              Anticipated d/c is to: Home              Anticipated d/c date is: 3 days              Patient currently is not medically stable to d/c.   Difficult to place patient No   Consultants:  none  Objective: Blood pressure 133/70, pulse 96, temperature 98 F (36.7 C), temperature source Oral, resp. rate (!) 28, height 5\' 9"  (1.753 m), weight 111.1 kg, SpO2 95 %.  Intake/Output Summary (Last 24 hours) at 11/26/2020 1229 Last data filed at 11/26/2020 1121 Gross per 24 hour  Intake 100.24 ml  Output 1200 ml  Net -1099.76 ml   Filed Weights   11/24/20 1657 11/26/20 1159  Weight: 111.1 kg 111.1 kg    Examination: General: Not in extremis - still requiring high-level oxygen support Lungs: Distant breath sounds related to body habitus - fine crackles diffusely bilaterally Cardiovascular: RRR Abdomen: NT/ND, soft, bs+ - obese Extremities: No signif edema bilateral lower extremities  CBC: Recent Labs  Lab 11/24/20 1711 11/25/20 0555 11/26/20 0422  WBC 8.0 5.5 10.5  NEUTROABS 6.7 4.7 9.2*  HGB 12.6*  12.3* 13.2  HCT 36.9* 36.4* 38.8*  MCV 85.0 85.2 84.5  PLT 334 346 123456*   Basic Metabolic Panel: Recent Labs  Lab 11/24/20 1711 11/25/20 0555 11/25/20 2206 11/26/20 0422  NA 131* 133*  --  137  K 4.5 4.7  --  5.0  CL 94* 100  --  99  CO2 19* 17*  --  17*  GLUCOSE 263* 287* 390* 335*  BUN 21* 25*  --  38*  CREATININE 0.98 1.05  --  1.07  CALCIUM 8.3* 8.4*  --  9.1   GFR: Estimated Creatinine Clearance: 98.1 mL/min (by C-G formula based on SCr of 1.07 mg/dL).  Liver Function Tests: Recent Labs  Lab 11/24/20 1711 11/25/20 0555 11/26/20 0422  AST 31 21 18   ALT 32 27 31  ALKPHOS 45 41 50  BILITOT 1.3* 1.2 1.3*  PROT 6.9 6.6 7.2  ALBUMIN 2.9* 2.7* 3.0*     Coagulation Profile: Recent Labs  Lab 11/24/20 1711  INR 1.0    HbA1C: Hgb A1c MFr Bld  Date/Time Value Ref Range Status  11/26/2020 04:22 AM 9.9 (H) 4.8 - 5.6 % Final    Comment:    (NOTE) Pre diabetes:          5.7%-6.4%  Diabetes:              >6.4%  Glycemic control for   <7.0% adults with diabetes     CBG: Recent Labs  Lab 11/25/20 2143 11/26/20 0803 11/26/20 1157  GLUCAP 525* 323* 289*    Recent Results (from the past 240 hour(s))  Culture, blood (Routine X 2) w Reflex to ID Panel     Status: None (Preliminary result)   Collection Time: 11/26/20  4:22 AM   Specimen: BLOOD  Result Value Ref Range Status   Specimen Description BLOOD LEFT AC  Final   Special Requests   Final    BOTTLES DRAWN AEROBIC AND ANAEROBIC Blood Culture adequate volume   Culture   Final    NO GROWTH < 12 HOURS Performed at [facility], [address], [place], [place] [address]    Report Status PENDING  Incomplete  Culture, blood (Routine X 2) w Reflex to ID Panel     Status: None (Preliminary result)   Collection Time: 11/26/20  4:22 AM   Specimen: BLOOD  Result Value Ref Range Status   Specimen Description BLOOD LEFT ARM  Final   Special Requests   Final    BOTTLES DRAWN AEROBIC AND ANAEROBIC Blood Culture adequate volume   Culture   Final    NO GROWTH < 12 HOURS Performed at [facility], [address] [place], [place] [address]    Report Status PENDING  Incomplete     Scheduled Meds: . albuterol  2 puff Inhalation Q4H while awake  . vitamin C  500 mg Oral Daily  . aspirin EC  81 mg Oral Daily  . baricitinib  4 mg Oral Daily  . enoxaparin (LOVENOX) injection  0.5 mg/kg Subcutaneous Q24H  . insulin aspart  0-20 Units Subcutaneous TID WC  . insulin aspart  0-5 Units Subcutaneous QHS  . linagliptin  5 mg Oral Daily  . lisinopril  40 mg Oral Daily  . methylPREDNISolone (SOLU-MEDROL) injection  1 mg/kg Intravenous Q12H   Followed by  . [START  ON 11/28/2020] predniSONE  50 mg Oral Daily  . metoprolol tartrate  50 mg Oral BID  . simvastatin  40 mg Oral QPM  .  zinc sulfate  220 mg Oral Daily   Continuous Infusions: . remdesivir 100 mg in NS 100 mL Stopped (11/26/20 1046)     LOS: 2 days   Cherene Altes, MD [facility] Office  [phone] Pager - Text Page per Amion  If 7PM-7AM, please contact night-coverage per Amion 11/26/2020, 12:29 PM

## 2020-11-26 NOTE — Progress Notes (Signed)
Inpatient Diabetes Program Recommendations  AACE/ADA: New Consensus Statement on Inpatient Glycemic Control (2015)  Target Ranges:  Prepandial:   less than 140 mg/dL      Peak postprandial:   less than 180 mg/dL (1-2 hours)      Critically ill patients:  140 - 180 mg/dL   Results for LAKODA, MCANANY (MRN [id number]) as of 11/26/2020 10:21  Ref. Range 11/25/2020 21:43 11/26/2020 08:03  Glucose-Capillary Latest Ref Range: 70 - 99 mg/dL 525 (HH)  5 units NOVOLOG @11 :43pm 323 (H)  15 units NOVOLOG     Admit Worsening SOB due to COVID (dxd 1/24)  History: DM2   Home DM Meds: Glipizide 5 mg BID        Metformin 1000 mg BID  Current Orders: Novolog 0-20 units ac/hs      Tradjenta 5 mg Daily   Solumedrol 111 mg BID    MD- Note CBG was 323 this AM.  Please consider adding Lantus 15 units Daily (0.15 units/kg)     --Will follow patient during hospitalization--  Wyn Quaker RN, MSN, CDE Diabetes Coordinator Inpatient Glycemic Control Team Team Pager: [phone] (8a-5p)

## 2020-11-26 NOTE — ED Notes (Signed)
Pt resting comfortably with eyes closed, will continue to monitor.  

## 2020-11-26 NOTE — ED Notes (Signed)
No change in condition, will continue to monitor.  

## 2020-11-26 NOTE — ED Notes (Signed)
Resting at present  denies any complaints

## 2020-11-26 NOTE — ED Notes (Signed)
Pt resting comfortably with eyes closed

## 2020-11-26 NOTE — ED Notes (Signed)
Report received from Lorrie RN. Patient care assumed. Patient/RN introduction complete. Will continue to monitor.  

## 2020-11-26 NOTE — ED Notes (Signed)
Pt given meal tray for lunch 

## 2020-11-27 LAB — COMPREHENSIVE METABOLIC PANEL
ALT: 29 U/L (ref 0–44)
AST: 23 U/L (ref 15–41)
Albumin: 2.8 g/dL — ABNORMAL LOW (ref 3.5–5.0)
Alkaline Phosphatase: 50 U/L (ref 38–126)
Anion gap: 15 (ref 5–15)
BUN: 39 mg/dL — ABNORMAL HIGH (ref 6–20)
CO2: 20 mmol/L — ABNORMAL LOW (ref 22–32)
Calcium: 8.5 mg/dL — ABNORMAL LOW (ref 8.9–10.3)
Chloride: 96 mmol/L — ABNORMAL LOW (ref 98–111)
Creatinine, Ser: 0.87 mg/dL (ref 0.61–1.24)
GFR, Estimated: >60 mL/min (ref >60)
Glucose, Bld: 357 mg/dL — ABNORMAL HIGH (ref 70–99)
Potassium: 5.1 mmol/L (ref 3.5–5.1)
Sodium: 131 mmol/L — ABNORMAL LOW (ref 135–145)
Total Bilirubin: 1.1 mg/dL (ref 0.3–1.2)
Total Protein: 6.6 g/dL (ref 6.5–8.1)

## 2020-11-27 LAB — CBC WITH DIFFERENTIAL/PLATELET
Abs Immature Granulocytes: 0.19 10*3/uL — ABNORMAL HIGH (ref 0.00–0.07)
Basophils Absolute: 0 10*3/uL (ref 0.0–0.1)
Basophils Relative: 0 %
Eosinophils Absolute: 0 10*3/uL (ref 0.0–0.5)
Eosinophils Relative: 0 %
HCT: 38.5 % — ABNORMAL LOW (ref 39.0–52.0)
Hemoglobin: 13.3 g/dL (ref 13.0–17.0)
Immature Granulocytes: 2 %
Lymphocytes Relative: 5 %
Lymphs Abs: 0.5 10*3/uL — ABNORMAL LOW (ref 0.7–4.0)
MCH: 28.9 pg (ref 26.0–34.0)
MCHC: 34.5 g/dL (ref 30.0–36.0)
MCV: 83.5 fL (ref 80.0–100.0)
Monocytes Absolute: 0.6 10*3/uL (ref 0.1–1.0)
Monocytes Relative: 6 %
Neutro Abs: 8.6 10*3/uL — ABNORMAL HIGH (ref 1.7–7.7)
Neutrophils Relative %: 87 %
Platelets: 545 10*3/uL — ABNORMAL HIGH (ref 150–400)
RBC: 4.61 MIL/uL (ref 4.22–5.81)
RDW: 13.1 % (ref 11.5–15.5)
WBC: 9.8 10*3/uL (ref 4.0–10.5)
nRBC: 0 % (ref 0.0–0.2)

## 2020-11-27 LAB — C-REACTIVE PROTEIN: CRP: 6 mg/dL — ABNORMAL HIGH (ref <1.0)

## 2020-11-27 LAB — FERRITIN: Ferritin: 491 ng/mL — ABNORMAL HIGH (ref 24–336)

## 2020-11-27 LAB — GLUCOSE, CAPILLARY
Glucose-Capillary: 353 mg/dL — ABNORMAL HIGH (ref 70–99)
Glucose-Capillary: 333 mg/dL — ABNORMAL HIGH (ref 70–99)
Glucose-Capillary: 301 mg/dL — ABNORMAL HIGH (ref 70–99)
Glucose-Capillary: 334 mg/dL — ABNORMAL HIGH (ref 70–99)

## 2020-11-27 LAB — FIBRIN DERIVATIVES D-DIMER (ARMC ONLY): Fibrin derivatives D-dimer (ARMC): 982.84 ng/mL (FEU) — ABNORMAL HIGH (ref 0.00–499.00)

## 2020-11-27 MED ORDER — GUAIFENESIN ER 600 MG PO TB12
1200.0000 mg | ORAL_TABLET | Freq: Two times a day (BID) | ORAL | Status: DC
  Administered 2020-11-27 – 2020-11-28 (×4): 1200 mg via ORAL
  Filled 2020-11-27 (×4): qty 2

## 2020-11-27 MED ORDER — FUROSEMIDE 10 MG/ML IJ SOLN
40.0000 mg | Freq: Once | INTRAMUSCULAR | Status: AC
  Administered 2020-11-27: 40 mg via INTRAVENOUS
  Filled 2020-11-27: qty 4

## 2020-11-27 MED ORDER — INSULIN ASPART 100 UNIT/ML ~~LOC~~ SOLN
5.0000 [IU] | Freq: Three times a day (TID) | SUBCUTANEOUS | Status: DC
  Administered 2020-11-27 – 2020-11-28 (×5): 5 [IU] via SUBCUTANEOUS
  Filled 2020-11-27 (×5): qty 1

## 2020-11-27 MED ORDER — INSULIN GLARGINE 100 UNIT/ML ~~LOC~~ SOLN
25.0000 [IU] | Freq: Every day | SUBCUTANEOUS | Status: DC
  Administered 2020-11-27 – 2020-11-28 (×2): 25 [IU] via SUBCUTANEOUS
  Filled 2020-11-27 (×3): qty 0.25

## 2020-11-27 NOTE — Progress Notes (Signed)
Pt refused cardiac monitoring but requested for continuous pulse ox. CCMD called, pt is on AR1c-X13. Transmitting.

## 2020-11-27 NOTE — Progress Notes (Signed)
Oxygen humidified for nasal dryness. MD on call notified of patients request for nasal spray. Per MD,saline spray will make it worst. Qtip with vazeline gauze given to pt.

## 2020-11-27 NOTE — Progress Notes (Signed)
[facility]  PROGRESS NOTE  [name]. FBP:[id number] DOB: 17-Oct-1966 DOA: 11/24/2020 PCP: Baxter Hire, MD   Brief HPI:   55 year old male with history of hypertension, hearing loss, obesity, diabetes mellitus type 2, hyperlipidemia, GERD came to [facility] ER with severe shortness of breath.  He was diagnosed with COVID-19 on 11/18/2020.  Since onset of symptoms his shortness of breath has slowly but consistently worsened.  He called EMS was found to be hypoxemic with saturation of 84% on room air.  Vaccination status-unvaccinated    Subjective   Patient seen and examined, breathing has improved.  Oxygen requirement is down to 3 L/min via nasal cannula.  CRP is down to 6.0.   Assessment/Plan:     1. Acute hypoxemic respiratory failure-secondary to COVID-19 pneumonia.  Patient started on remdesivir, Solu-Medrol, baricitinib.  Oxygen requirement has gone down to 3 L/min.  CRP is down to 6.0.  Patient is improving on current regimen. 2. D-dimer elevation-no evidence of PE on CTA chest.  Continue DVT prophylaxis with Lovenox. 3. Hypertension-blood pressures controlled, continue lisinopril, metoprolol. 4. Diabetes mellitus type 2-blood glucose significantly elevated, secondary to Solu-Medrol.  Dose of Lantus increased to 25 units subcu daily, prandial insulin changed to 5 units subcu 3 times daily with meals.  Continue resistant sliding scale insulin with NovoLog.  Continue linagliptin 5. Hyperlipidemia-continue simvastatin     COVID-19 Labs  Recent Labs    11/25/20 0555 11/26/20 0422 11/27/20 0818 11/27/20 0819  FERRITIN  --  455* 491*  --   CRP 19.8* 13.9*  --  6.0*    No results found for: SARSCOV2NAA   Scheduled medications:   . vitamin C  500 mg Oral Daily  . aspirin EC  81 mg Oral Daily  . baricitinib  4 mg Oral Daily  . enoxaparin (LOVENOX) injection  0.5 mg/kg Subcutaneous Q24H  . guaiFENesin  1,200 mg Oral BID  . insulin aspart  0-20 Units  Subcutaneous TID WC  . insulin aspart  0-5 Units Subcutaneous QHS  . insulin aspart  5 Units Subcutaneous TID WC  . insulin glargine  25 Units Subcutaneous Daily  . linagliptin  5 mg Oral Daily  . lisinopril  40 mg Oral Daily  . methylPREDNISolone (SOLU-MEDROL) injection  1 mg/kg Intravenous Q12H   Followed by  . [START ON 11/28/2020] predniSONE  50 mg Oral Daily  . metoprolol tartrate  50 mg Oral BID  . simvastatin  40 mg Oral QPM  . zinc sulfate  220 mg Oral Daily         CBG: Recent Labs  Lab 11/26/20 1601 11/26/20 2052 11/27/20 0816 11/27/20 1202 11/27/20 1601  GLUCAP 302* 302* 353* 333* 301*    SpO2: 96 % O2 Flow Rate (L/min): 3 L/min    CBC: Recent Labs  Lab 11/20/20 2159 11/24/20 1711 11/25/20 0555 11/26/20 0422 11/27/20 0818  WBC 3.3* 8.0 5.5 10.5 9.8  NEUTROABS 2.4 6.7 4.7 9.2* 8.6*  HGB 14.5 12.6* 12.3* 13.2 13.3  HCT 42.0 36.9* 36.4* 38.8* 38.5*  MCV 84.8 85.0 85.2 84.5 83.5  PLT 194 334 346 460* 545*    Basic Metabolic Panel: Recent Labs  Lab 11/20/20 2159 11/24/20 1711 11/25/20 0555 11/25/20 2206 11/26/20 0422 11/27/20 0818  NA 130* 131* 133*  --  137 131*  K 4.3 4.5 4.7  --  5.0 5.1  CL 93* 94* 100  --  99 96*  CO2 24 19* 17*  --  17* 20*  GLUCOSE 202* 263* 287* 390* 335* 357*  BUN 23* 21* 25*  --  38* 39*  CREATININE 1.29* 0.98 1.05  --  1.07 0.87  CALCIUM 8.2* 8.3* 8.4*  --  9.1 8.5*     Liver Function Tests: Recent Labs  Lab 11/24/20 1711 11/25/20 0555 11/26/20 0422 11/27/20 0818  AST 31 21 18 23   ALT 32 27 31 29   ALKPHOS 45 41 50 50  BILITOT 1.3* 1.2 1.3* 1.1  PROT 6.9 6.6 7.2 6.6  ALBUMIN 2.9* 2.7* 3.0* 2.8*     Antibiotics: Anti-infectives (From admission, onward)   Start     Dose/Rate Route Frequency Ordered Stop   11/25/20 1000  remdesivir 100 mg in sodium chloride 0.9 % 100 mL IVPB       "Followed by" Linked Group Details   100 mg 200 mL/hr over 30 Minutes Intravenous Daily 11/24/20 1715 11/29/20 0959    11/24/20 1800  remdesivir 200 mg in sodium chloride 0.9% 250 mL IVPB       "Followed by" Linked Group Details   200 mg 580 mL/hr over 30 Minutes Intravenous  Once 11/24/20 1715 11/24/20 1907       DVT prophylaxis: Lovenox  Code Status: Full code  Family Communication: No family at bedside   Consultants:    Procedures:      Objective   Vitals:   11/27/20 0853 11/27/20 1000 11/27/20 1139 11/27/20 1600  BP:   109/65 115/72  Pulse:   74 66  Resp:   18 18  Temp:   97.7 F (36.5 C) (!) 97.4 F (36.3 C)  TempSrc:   Oral Oral  SpO2: 95% 95% 95% 96%  Weight:      Height:        Intake/Output Summary (Last 24 hours) at 11/27/2020 1647 Last data filed at 11/27/2020 1600 Gross per 24 hour  Intake --  Output 1900 ml  Net -1900 ml    01/31 1901 - 02/02 0700 In: 100.2  Out: 1500 [Urine:1500]  Filed Weights   11/24/20 1657 11/26/20 1159  Weight: 111.1 kg 111.1 kg    Physical Examination:  General-appears in no acute distress Heart-S1-S2, regular, no murmur auscultated Lungs-clear to auscultation bilaterally, no wheezing or crackles auscultated Abdomen-soft, nontender, no organomegaly Extremities-no edema in the lower extremities Neuro-alert, oriented x3, no focal deficit noted  Status is: Inpatient  Dispo: The patient is from: Home              Anticipated d/c is to: Home              Anticipated d/c date is: 11/29/2020              Patient currently not medically stable for discharge  Barrier to discharge-ongoing treatment for acute hypoxemic respiratory failure        Data Reviewed:   Recent Results (from the past 240 hour(s))  Culture, blood (Routine X 2) w Reflex to ID Panel     Status: None (Preliminary result)   Collection Time: 11/26/20  4:22 AM   Specimen: BLOOD  Result Value Ref Range Status   Specimen Description BLOOD LEFT [facility]  Final   Special Requests   Final    BOTTLES DRAWN AEROBIC AND ANAEROBIC Blood Culture adequate volume   Culture    Final    NO GROWTH 1 DAY Performed at [facility], [address], [place], [place] [address]    Report Status PENDING  Incomplete  Culture, blood (  Routine X 2) w Reflex to ID Panel     Status: None (Preliminary result)   Collection Time: 11/26/20  4:22 AM   Specimen: BLOOD  Result Value Ref Range Status   Specimen Description BLOOD LEFT ARM  Final   Special Requests   Final    BOTTLES DRAWN AEROBIC AND ANAEROBIC Blood Culture adequate volume   Culture   Final    NO GROWTH 1 DAY Performed at [facility], [address], [place], [place] [address]    Report Status PENDING  Incomplete        Oswald Hillock   [facility] If 7PM-7AM, please contact night-coverage at [email or web address], Office  [phone]   11/27/2020, 4:47 PM  LOS: 3 days

## 2020-11-28 LAB — CBC WITH DIFFERENTIAL/PLATELET
Abs Immature Granulocytes: 0.19 10*3/uL — ABNORMAL HIGH (ref 0.00–0.07)
Basophils Absolute: 0 10*3/uL (ref 0.0–0.1)
Basophils Relative: 0 %
Eosinophils Absolute: 0 10*3/uL (ref 0.0–0.5)
Eosinophils Relative: 0 %
HCT: 38.5 % — ABNORMAL LOW (ref 39.0–52.0)
Hemoglobin: 13.7 g/dL (ref 13.0–17.0)
Immature Granulocytes: 2 %
Lymphocytes Relative: 5 %
Lymphs Abs: 0.4 10*3/uL — ABNORMAL LOW (ref 0.7–4.0)
MCH: 29.2 pg (ref 26.0–34.0)
MCHC: 35.6 g/dL (ref 30.0–36.0)
MCV: 82.1 fL (ref 80.0–100.0)
Monocytes Absolute: 0.7 10*3/uL (ref 0.1–1.0)
Monocytes Relative: 8 %
Neutro Abs: 7.1 10*3/uL (ref 1.7–7.7)
Neutrophils Relative %: 85 %
Platelets: 574 10*3/uL — ABNORMAL HIGH (ref 150–400)
RBC: 4.69 MIL/uL (ref 4.22–5.81)
RDW: 12.6 % (ref 11.5–15.5)
WBC: 8.4 10*3/uL (ref 4.0–10.5)
nRBC: 0 % (ref 0.0–0.2)

## 2020-11-28 LAB — COMPREHENSIVE METABOLIC PANEL
ALT: 33 U/L (ref 0–44)
AST: 28 U/L (ref 15–41)
Albumin: 2.7 g/dL — ABNORMAL LOW (ref 3.5–5.0)
Alkaline Phosphatase: 48 U/L (ref 38–126)
Anion gap: 13 (ref 5–15)
BUN: 37 mg/dL — ABNORMAL HIGH (ref 6–20)
CO2: 22 mmol/L (ref 22–32)
Calcium: 8.3 mg/dL — ABNORMAL LOW (ref 8.9–10.3)
Chloride: 96 mmol/L — ABNORMAL LOW (ref 98–111)
Creatinine, Ser: 0.86 mg/dL (ref 0.61–1.24)
GFR, Estimated: >60 mL/min (ref >60)
Glucose, Bld: 309 mg/dL — ABNORMAL HIGH (ref 70–99)
Potassium: 4.7 mmol/L (ref 3.5–5.1)
Sodium: 131 mmol/L — ABNORMAL LOW (ref 135–145)
Total Bilirubin: 0.9 mg/dL (ref 0.3–1.2)
Total Protein: 6.1 g/dL — ABNORMAL LOW (ref 6.5–8.1)

## 2020-11-28 LAB — FERRITIN: Ferritin: 418 ng/mL — ABNORMAL HIGH (ref 24–336)

## 2020-11-28 LAB — FIBRIN DERIVATIVES D-DIMER (ARMC ONLY): Fibrin derivatives D-dimer (ARMC): 800.43 ng/mL (FEU) — ABNORMAL HIGH (ref 0.00–499.00)

## 2020-11-28 LAB — GLUCOSE, CAPILLARY
Glucose-Capillary: 347 mg/dL — ABNORMAL HIGH (ref 70–99)
Glucose-Capillary: 285 mg/dL — ABNORMAL HIGH (ref 70–99)
Glucose-Capillary: 331 mg/dL — ABNORMAL HIGH (ref 70–99)
Glucose-Capillary: 314 mg/dL — ABNORMAL HIGH (ref 70–99)

## 2020-11-28 LAB — C-REACTIVE PROTEIN: CRP: 3 mg/dL — ABNORMAL HIGH (ref <1.0)

## 2020-11-29 LAB — CBC WITH DIFFERENTIAL/PLATELET
Abs Immature Granulocytes: 0.16 10*3/uL — ABNORMAL HIGH (ref 0.00–0.07)
Basophils Absolute: 0 10*3/uL (ref 0.0–0.1)
Basophils Relative: 0 %
Eosinophils Absolute: 0 10*3/uL (ref 0.0–0.5)
Eosinophils Relative: 0 %
HCT: 39.9 % (ref 39.0–52.0)
Hemoglobin: 13.8 g/dL (ref 13.0–17.0)
Immature Granulocytes: 2 %
Lymphocytes Relative: 10 %
Lymphs Abs: 0.8 10*3/uL (ref 0.7–4.0)
MCH: 28.6 pg (ref 26.0–34.0)
MCHC: 34.6 g/dL (ref 30.0–36.0)
MCV: 82.8 fL (ref 80.0–100.0)
Monocytes Absolute: 0.9 10*3/uL (ref 0.1–1.0)
Monocytes Relative: 11 %
Neutro Abs: 6.5 10*3/uL (ref 1.7–7.7)
Neutrophils Relative %: 77 %
Platelets: 502 10*3/uL — ABNORMAL HIGH (ref 150–400)
RBC: 4.82 MIL/uL (ref 4.22–5.81)
RDW: 12.4 % (ref 11.5–15.5)
WBC: 8.4 10*3/uL (ref 4.0–10.5)
nRBC: 0 % (ref 0.0–0.2)

## 2020-11-29 LAB — COMPREHENSIVE METABOLIC PANEL
ALT: 52 U/L — ABNORMAL HIGH (ref 0–44)
AST: 43 U/L — ABNORMAL HIGH (ref 15–41)
Albumin: 2.6 g/dL — ABNORMAL LOW (ref 3.5–5.0)
Alkaline Phosphatase: 52 U/L (ref 38–126)
Anion gap: 8 (ref 5–15)
BUN: 30 mg/dL — ABNORMAL HIGH (ref 6–20)
CO2: 25 mmol/L (ref 22–32)
Calcium: 8.5 mg/dL — ABNORMAL LOW (ref 8.9–10.3)
Chloride: 99 mmol/L (ref 98–111)
Creatinine, Ser: 0.78 mg/dL (ref 0.61–1.24)
GFR, Estimated: >60 mL/min (ref >60)
Glucose, Bld: 306 mg/dL — ABNORMAL HIGH (ref 70–99)
Potassium: 4.2 mmol/L (ref 3.5–5.1)
Sodium: 132 mmol/L — ABNORMAL LOW (ref 135–145)
Total Bilirubin: 0.9 mg/dL (ref 0.3–1.2)
Total Protein: 5.9 g/dL — ABNORMAL LOW (ref 6.5–8.1)

## 2020-11-29 LAB — FIBRIN DERIVATIVES D-DIMER (ARMC ONLY): Fibrin derivatives D-dimer (ARMC): 830.92 ng/mL (FEU) — ABNORMAL HIGH (ref 0.00–499.00)

## 2020-11-29 LAB — GLUCOSE, CAPILLARY: Glucose-Capillary: 389 mg/dL — ABNORMAL HIGH (ref 70–99)

## 2020-11-29 LAB — C-REACTIVE PROTEIN: CRP: 1.8 mg/dL — ABNORMAL HIGH (ref <1.0)

## 2020-11-29 LAB — FERRITIN: Ferritin: 490 ng/mL — ABNORMAL HIGH (ref 24–336)

## 2020-11-29 MED ORDER — ZINC SULFATE 220 (50 ZN) MG PO CAPS
220.0000 mg | ORAL_CAPSULE | Freq: Every day | ORAL | 0 refills | Status: AC
Start: 2020-11-29 — End: ?

## 2020-11-29 MED ORDER — ASCORBIC ACID 500 MG PO TABS: 500.0000 mg | ORAL_TABLET | Freq: Every day | ORAL | 0 refills | Status: AC

## 2020-11-29 MED ORDER — GUAIFENESIN ER 600 MG PO TB12: 1200.0000 mg | ORAL_TABLET | Freq: Two times a day (BID) | ORAL | 0 refills | Status: AC

## 2020-11-29 MED ORDER — DEXAMETHASONE 6 MG PO TABS: 6.0000 mg | ORAL_TABLET | Freq: Every day | ORAL | 0 refills | Status: AC

## 2020-11-29 NOTE — Discharge Summary (Signed)
Physician Discharge Summary  Johnny Barrera. ZDG:[id number] DOB: [birth year] DOA: 11/24/2020  PCP: Baxter Hire, MD  Admit date: 11/24/2020 Discharge date: 11/29/2020  Time spent: 50 minutes  Recommendations for Outpatient Follow-up:  1. Follow-up PCP in 2 weeks 2. Continue home isolation till 12/11/2020   Discharge Diagnoses:  Active Problems:   Acute hypoxemic respiratory failure due to COVID-19 [facility])   Pneumonia due to COVID-19 virus   Discharge Condition: Stable  Diet recommendation: Carb modified diet  Filed Weights   11/24/20 1657 11/26/20 1159  Weight: 111.1 kg 111.1 kg    History of present illness:  55 year old male with history of hypertension, hearing loss, obesity, diabetes mellitus type 2, hyperlipidemia, GERD came to [facility] ER with severe shortness of breath.  He was diagnosed with COVID-19 on 11/18/2020.  Since onset of symptoms his shortness of breath has slowly but consistently worsened.  He called EMS was found to be hypoxemic with saturation of 84% on room air.  Vaccination status-unvaccinated  Hospital Course:   1. Acute hypoxemic respiratory failure-resolved, secondary to COVID-19 pneumonia.  Patient was started on remdesivir, Solu-Medrol, baricitinib.    Patient's O2 sats is currently 91% on room air, however he desats on ambulation to 84% requiring 2 L/min of oxygen.  We will discharge him on oxygen 2 L/min.  Follow-up PCP in 2 weeks. CRP is down to 3.0. 2. D-dimer elevation-no evidence of PE on CTA chest.  Continue DVT prophylaxis with Lovenox. 3. Hypertension-blood pressures controlled, continue lisinopril, metoprolol. 4. Diabetes mellitus type 2-blood glucose significantly elevated, secondary to Solu-Medrol.   Patient was treated with high dose of Lantus 25 units subcu daily, prandial insulin NovoLog 5 units 3 times daily.  Resistant sliding scale insulin.  Linagliptin.  At this time he will be discharged on low-dose of Decadron 6 mg daily for 4 more  days.  He will continue to use his home regimen. 5. Hyperlipidemia-continue simvastatin   Procedures:  Consultations:    Discharge Exam: Vitals:   11/29/20 0300 11/29/20 0805  BP: 116/81 109/72  Pulse: 70 95  Resp: 18 18  Temp: 97.7 F (36.5 C) 98.6 F (37 C)  SpO2: 92% 91%    General: Appears in no acute distress Cardiovascular: S1-S2, regular Respiratory: Clear to auscultation bilaterally  Discharge Instructions   Discharge Instructions    Diet - low sodium heart healthy   Complete by: As directed    Increase activity slowly   Complete by: As directed      Allergies as of 11/29/2020   No Known Allergies     Medication List    STOP taking these medications   MULTIVITAMIN PO     TAKE these medications   ascorbic acid 500 MG tablet Commonly known as: VITAMIN C Take 1 tablet (500 mg total) by mouth daily.   aspirin EC 81 MG tablet Take 81 mg by mouth daily.   dexamethasone 6 MG tablet Commonly known as: DECADRON Take 1 tablet (6 mg total) by mouth daily. Start taking on: November 30, 2020   Fenofibrate 50 MG Caps Take 50 mg by mouth daily.   glipiZIDE 5 MG tablet Commonly known as: GLUCOTROL Take 5 mg by mouth 2 (two) times daily before a meal.   guaiFENesin 600 MG 12 hr tablet Commonly known as: MUCINEX Take 2 tablets (1,200 mg total) by mouth 2 (two) times daily.   lisinopril 40 MG tablet Commonly known as: ZESTRIL Take 40 mg by mouth daily.   metFORMIN  1000 MG tablet Commonly known as: GLUCOPHAGE Take 1,000 mg by mouth 2 (two) times daily.   metoprolol tartrate 50 MG tablet Commonly known as: LOPRESSOR Take 50 mg by mouth 2 (two) times daily.   naproxen sodium 220 MG tablet Commonly known as: ALEVE Take 220 mg by mouth 2 (two) times daily as needed (for pain or headache).   simvastatin 40 MG tablet Commonly known as: ZOCOR Take 40 mg by mouth every evening.   zinc sulfate 220 (50 Zn) MG capsule Take 1 capsule (220 mg total) by  mouth daily.            Durable Medical Equipment  (From admission, onward)         Start     Ordered   11/29/20 0920  DME Oxygen  Once       Question Answer Comment  Length of Need 6 Months   Mode or (Route) Nasal cannula   Liters per Minute 2   Frequency Continuous (stationary and portable oxygen unit needed)   Oxygen conserving device Yes   Oxygen delivery system Gas      11/29/20 0920         No Known Allergies    The results of significant diagnostics from this hospitalization (including imaging, microbiology, ancillary and laboratory) are listed below for reference.    Significant Diagnostic Studies: DG Chest 2 View  Result Date: 11/20/2020 CLINICAL DATA:  Shortness of breath, COVID. EXAM: CHEST - 2 VIEW COMPARISON:  10/21/2018 FINDINGS: Cardiomegaly. Interstitial prominence and vague patchy opacities throughout the lungs concerning for pneumonia. No effusions. No acute bony abnormality. IMPRESSION: Interstitial prominence and vague bilateral opacities concerning for COVID pneumonia. Electronically Signed   By: Rolm Baptise M.D.   On: 11/20/2020 21:31   CT Angio Chest PE W and/or Wo Contrast  Result Date: 11/24/2020 CLINICAL DATA:  Shortness of breath. Diagnosed with COVID 10 days ago. EXAM: CT ANGIOGRAPHY CHEST WITH CONTRAST TECHNIQUE: Multidetector CT imaging of the chest was performed using the standard protocol during bolus administration of intravenous contrast. Multiplanar CT image reconstructions and MIPs were obtained to evaluate the vascular anatomy. CONTRAST:  154mL OMNIPAQUE IOHEXOL 350 MG/ML SOLN COMPARISON:  None. FINDINGS: Cardiovascular: Examination is limited by respiratory motion artifact and patient body habitus. Within that limitation, there is no central pulmonary embolus. The size of the main pulmonary artery is normal. Heart size is normal, with no pericardial effusion. The course and caliber of the aorta are normal. There is no atherosclerotic  calcification. Opacification decreased due to pulmonary arterial phase contrast bolus timing. Mediastinum/Nodes: -- No mediastinal lymphadenopathy. -- No hilar lymphadenopathy. -- No axillary lymphadenopathy. -- No supraclavicular lymphadenopathy. -- Normal thyroid gland where visualized. -  Unremarkable esophagus. Lungs/Pleura: There are diffuse bilateral ground-glass airspace opacities. No pneumothorax or large pleural effusion. The trachea is unremarkable. Upper Abdomen: Contrast bolus timing is not optimized for evaluation of the abdominal organs. There is probable hepatic steatosis. Musculoskeletal: No chest wall abnormality. No bony spinal canal stenosis. Review of the MIP images confirms the above findings. IMPRESSION: 1. Examination is limited by respiratory motion artifact and patient body habitus. Within that limitation, there is no central pulmonary embolus. 2. Diffuse bilateral ground-glass airspace opacities, consistent with the patient's history of viral pneumonia. 3. Probable hepatic steatosis. Electronically Signed   By: Constance Holster M.D.   On: 11/24/2020 19:51   DG Chest Portable 1 View  Result Date: 11/24/2020 CLINICAL DATA:  COVID positive, hypoxia EXAM: PORTABLE CHEST 1  VIEW COMPARISON:  Chest radiograph dated 11/20/2020 FINDINGS: The heart remains enlarged. Moderate to severe diffuse bilateral interstitial and airspace opacities appear increased since prior exam. There is no pleural effusion or pneumothorax. The osseous structures are intact. IMPRESSION: Moderate to severe diffuse bilateral interstitial and airspace opacities appear increased since prior exam, consistent with COVID-19 pneumonia. Electronically Signed   By: Zerita Boers M.D.   On: 11/24/2020 18:34    Microbiology: Recent Results (from the past 240 hour(s))  Culture, blood (Routine X 2) w Reflex to ID Panel     Status: None (Preliminary result)   Collection Time: 11/26/20  4:22 AM   Specimen: BLOOD  Result Value  Ref Range Status   Specimen Description BLOOD LEFT [facility]  Final   Special Requests   Final    BOTTLES DRAWN AEROBIC AND ANAEROBIC Blood Culture adequate volume   Culture   Final    NO GROWTH 3 DAYS Performed at [facility], [address] [place], [place] [address]    Report Status PENDING  Incomplete  Culture, blood (Routine X 2) w Reflex to ID Panel     Status: None (Preliminary result)   Collection Time: 11/26/20  4:22 AM   Specimen: BLOOD  Result Value Ref Range Status   Specimen Description BLOOD LEFT ARM  Final   Special Requests   Final    BOTTLES DRAWN AEROBIC AND ANAEROBIC Blood Culture adequate volume   Culture   Final    NO GROWTH 3 DAYS Performed at [facility], [address] [place], [place] [address]    Report Status PENDING  Incomplete     Labs: Basic Metabolic Panel: Recent Labs  Lab 11/25/20 0555 11/25/20 2206 11/26/20 0422 11/27/20 0818 11/28/20 0429 11/29/20 0427  NA 133*  --  137 131* 131* 132*  K 4.7  --  5.0 5.1 4.7 4.2  CL 100  --  99 96* 96* 99  CO2 17*  --  17* 20* 22 25  GLUCOSE 287* 390* 335* 357* 309* 306*  BUN 25*  --  38* 39* 37* 30*  CREATININE 1.05  --  1.07 0.87 0.86 0.78  CALCIUM 8.4*  --  9.1 8.5* 8.3* 8.5*   Liver Function Tests: Recent Labs  Lab 11/25/20 0555 11/26/20 0422 11/27/20 0818 11/28/20 0429 11/29/20 0427  AST 21 18 23 28  43*  ALT 27 31 29  33 52*  ALKPHOS 41 50 50 48 52  BILITOT 1.2 1.3* 1.1 0.9 0.9  PROT 6.6 7.2 6.6 6.1* 5.9*  ALBUMIN 2.7* 3.0* 2.8* 2.7* 2.6*   No results for input(s): LIPASE, AMYLASE in the last 168 hours. No results for input(s): AMMONIA in the last 168 hours. CBC: Recent Labs  Lab 11/25/20 0555 11/26/20 0422 11/27/20 0818 11/28/20 0429 11/29/20 0427  WBC 5.5 10.5 9.8 8.4 8.4  NEUTROABS 4.7 9.2* 8.6* 7.1 6.5  HGB 12.3* 13.2 13.3 13.7 13.8  HCT 36.4* 38.8* 38.5* 38.5* 39.9  MCV 85.2 84.5 83.5 82.1 82.8  PLT 346 460* 545* 574* 502*   Cardiac Enzymes: No  results for input(s): CKTOTAL, CKMB, CKMBINDEX, TROPONINI in the last 168 hours. BNP: BNP (last 3 results) No results for input(s): BNP in the last 8760 hours.  ProBNP (last 3 results) No results for input(s): PROBNP in the last 8760 hours.  CBG: Recent Labs  Lab 11/28/20 0802 11/28/20 1202 11/28/20 1523 11/28/20 2114 11/29/20 0804  GLUCAP 347* 285* 331* 314* 389*       Signed:  Oswald Hillock MD.  [facility] 11/29/2020, 9:21 AM

## 2020-11-29 NOTE — Discharge Instructions (Signed)
Continue home isolation till 12/11/20  COVID-19 Quarantine vs. Isolation QUARANTINE keeps someone who was in close contact with someone who has COVID-19 away from others. Quarantine if you have been in close contact with someone who has COVID-19, unless you have been fully vaccinated. If you are fully vaccinated  You do NOT need to quarantine unless they have symptoms  Get tested 3-5 days after your exposure, even if you don't have symptoms  Wear a mask indoors in public for 14 days following exposure or until your test result is negative If you are not fully vaccinated  Stay home for 14 days after your last contact with a person who has COVID-19  Watch for fever (100.51F), cough, shortness of breath, or other symptoms of COVID-19  If possible, stay away from people you live with, especially people who are at higher risk for getting very sick from COVID-19  Contact your local public health department for options in your area to possibly shorten your quarantine ISOLATION keeps someone who is sick or tested positive for COVID-19 without symptoms away from others, even in their own home. People who are in isolation should stay home and stay in a specific "sick room" or area and use a separate bathroom (if available). If you are sick and think or know you have COVID-19 Stay home until after  At least 10 days since symptoms first appeared and  At least 24 hours with no fever without the use of fever-reducing medications and  Symptoms have improved If you tested positive for COVID-19 but do not have symptoms  Stay home until after 10 days have passed since your positive viral test  If you develop symptoms after testing positive, follow the steps above for those who are sick [email or web address] 07/22/2020 This information is not intended to replace advice given to you by your health care provider. Make sure you discuss any questions you have with your health care provider. Document  Revised: 08/26/2020 Document Reviewed: 08/26/2020 Elsevier Patient Education  2021 [employer].

## 2020-11-29 NOTE — Progress Notes (Signed)
Oxygen saturations 92-93% RA at rest. Saturations noted to fall to 84% RA with ambulation. 2L via [place] applied. Oxygen saturations maintained at 94% with 2L via [place] in place.

## 2020-11-29 NOTE — Progress Notes (Signed)
I spoke with wife Johnny Barrera) regarding oxygen for her husband.  Pt had refused to stay and wait for oxygen delivery but wife will come by and pick it up when it is delivered from [place].

## 2020-11-29 NOTE — TOC Transition Note (Signed)
Transition of Care [facility]) - CM/SW Discharge Note   Patient Details  Name: Johnny Barrera. MRN: [id number] Date of Birth: November 12, 1965  [facility] CM/SW Contact:  Shelbie Hutching, RN Phone Number: 11/29/2020, 9:40 AM   Clinical Narrative:    Patient admitted to the hospital with COVID requiring supplemental O2.  Patient is tolerating room air at rest but with exertion requires oxygen at 2L.  Oxygen has been ordered - Zach with [employer] given the referral.  RNCM was able to speak with patient via phone and told him that his oxygen should arrive within the hour.   Patient states he is not waiting an hour, he already has a ride on the way.   RNCM attempted to talk further with the patient but he hung up the phone.     Final next level of care: Home/Self Care Barriers to Discharge: No Barriers Identified   Patient Goals and CMS Choice Patient states their goals for this hospitalization and ongoing recovery are:: Just ready to get out of here      Discharge Placement                       Discharge Plan and Services                DME Arranged: Oxygen DME Agency: [employer] Date DME Agency Contacted: 11/29/20 Time DME Agency Contacted: [phone] Representative spoke with at DME Agency: [place] Arranged: NA          Social Determinants of Health ([place]) Interventions     Readmission Risk Interventions No flowsheet data found.

## 2020-11-29 NOTE — Progress Notes (Signed)
Pt refused all am meds including all insulin stating "that's on you!"  Pt was educated on his high blood sugar, pt continued to refuse.  Pt did accept and take his prednisone prior to discharge.  Pt received and verbalized understanding of all discharge instructions.  Pt also refused to wait for oxygen delivery.

## 2020-12-01 LAB — CULTURE, BLOOD (ROUTINE X 2)
Culture: NO GROWTH
Culture: NO GROWTH
Special Requests: ADEQUATE
Special Requests: ADEQUATE

## 2022-06-16 IMAGING — CR DG CHEST 2V
2 series · 2 of 2 positions shown · non-contrast
Comparison: 10/21/2018

CLINICAL DATA: Shortness of breath, COVID.

EXAM:
CHEST - 2 VIEW

[chest lat]
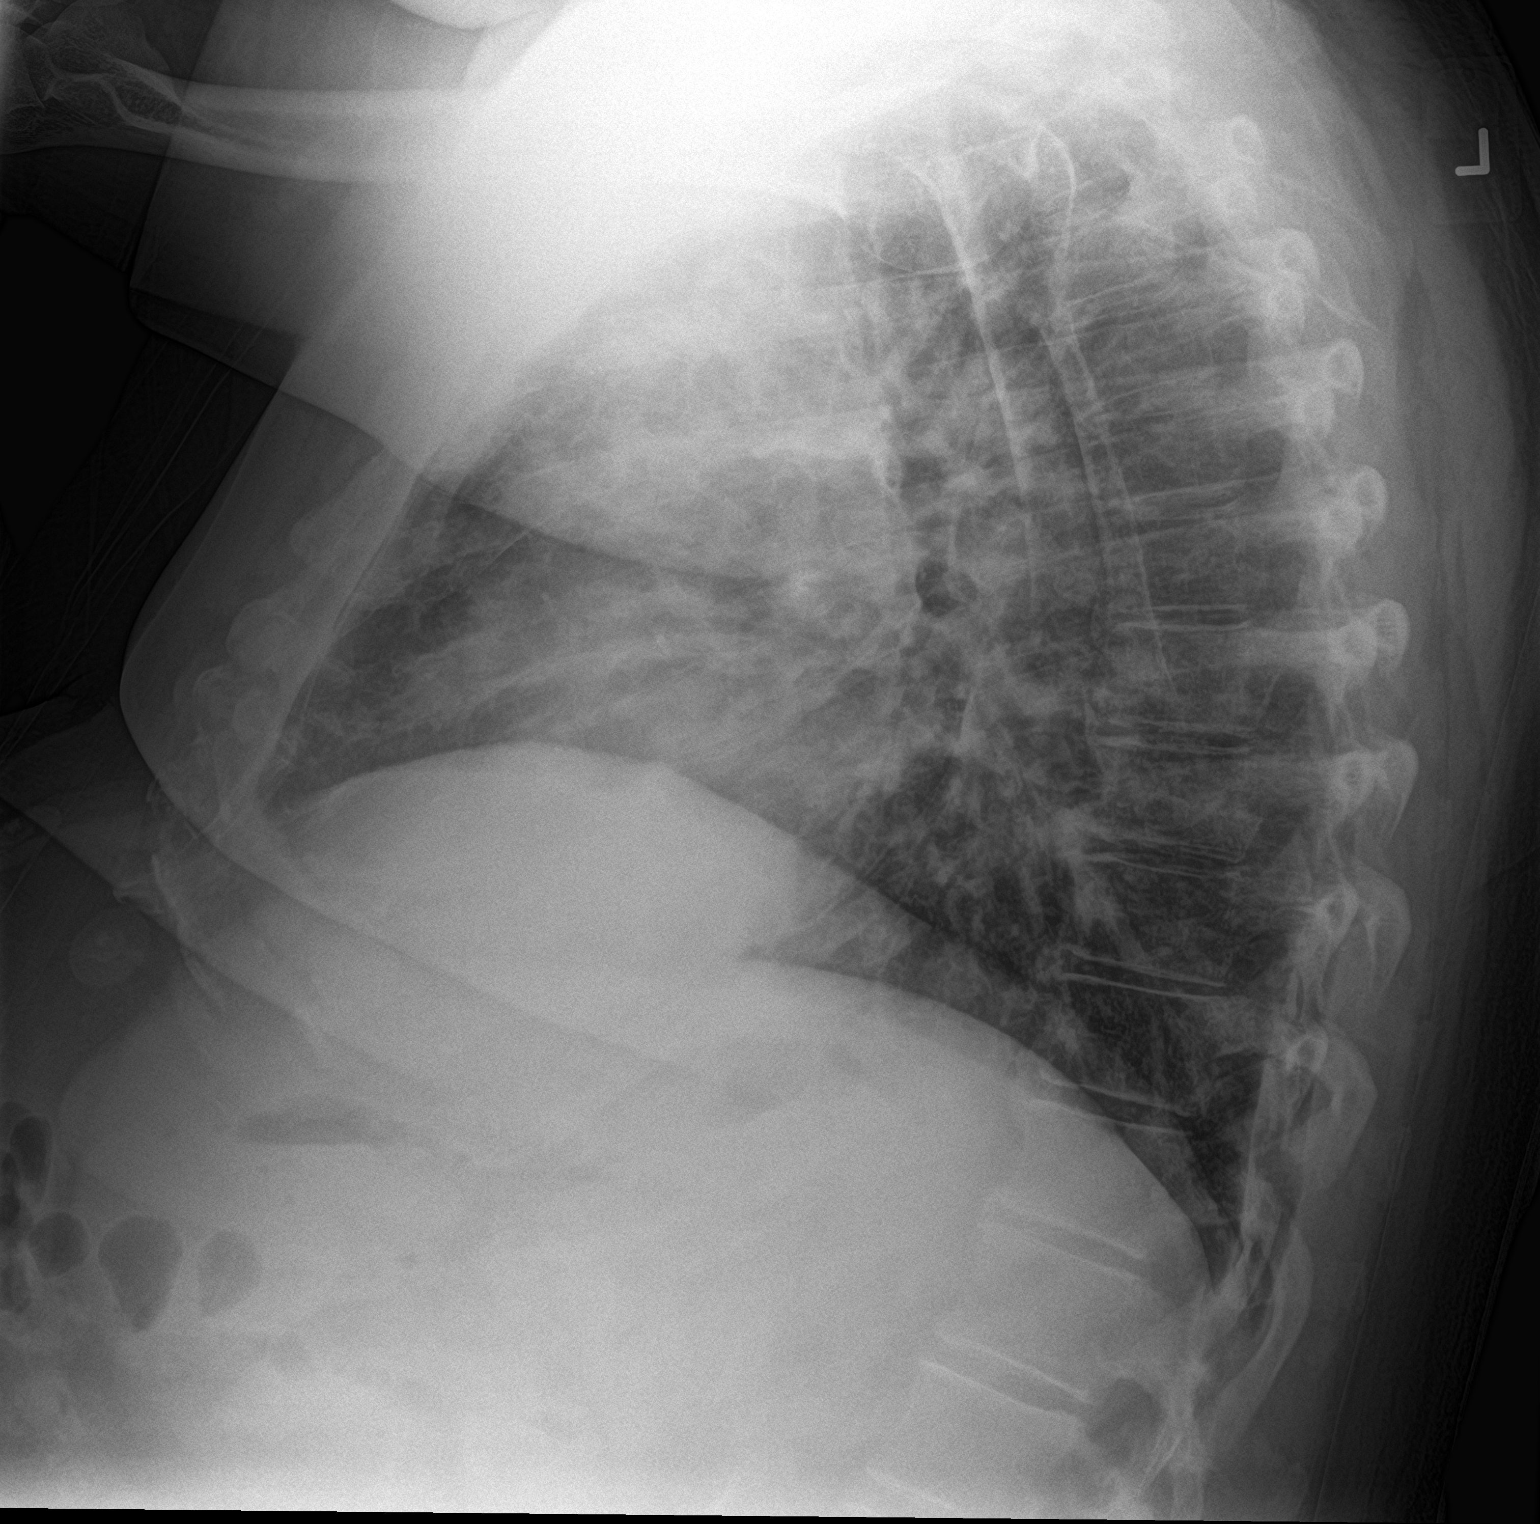

[chest ap]
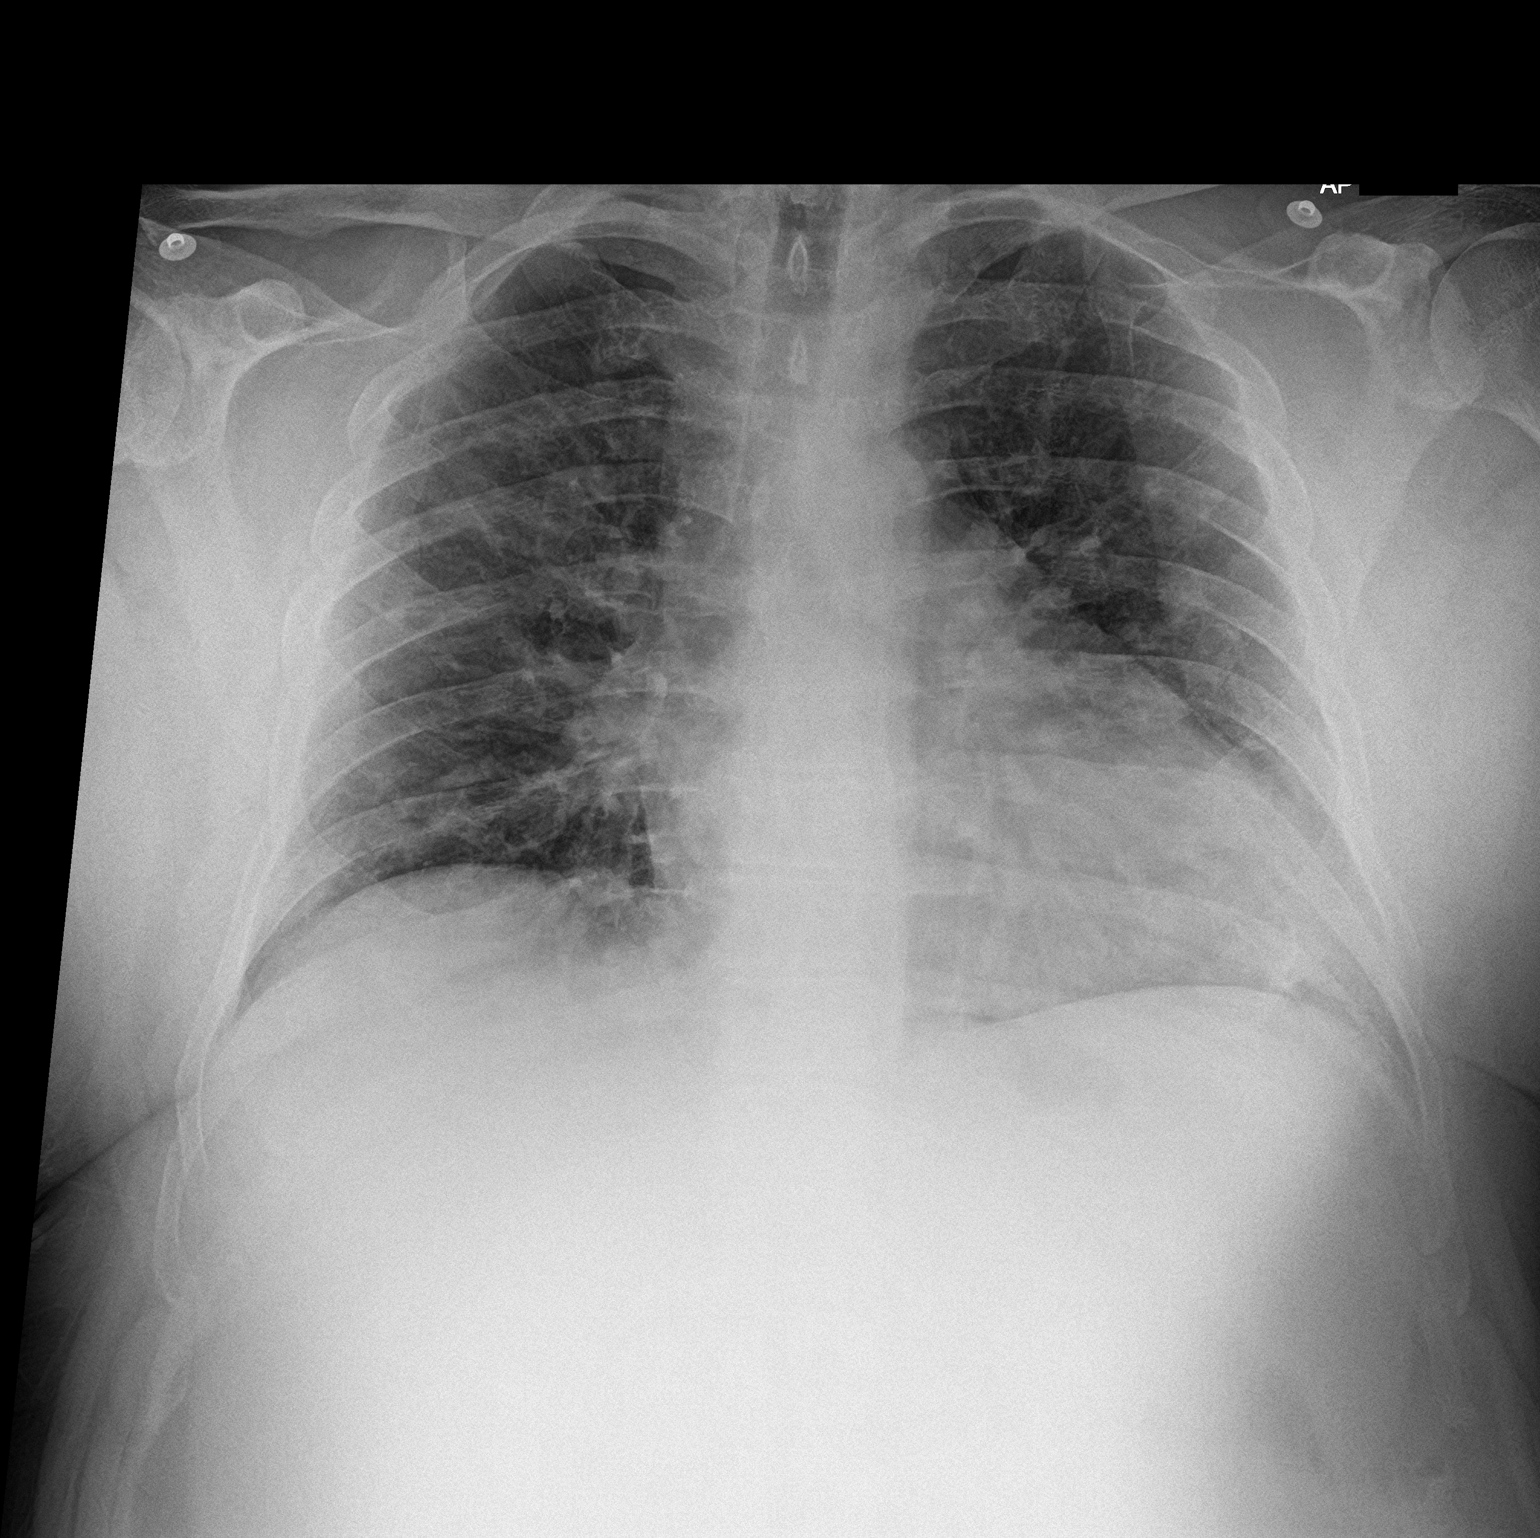

[2 of 2 positions shown; findings below may reference images not displayed]

FINDINGS: Cardiomegaly. Interstitial prominence and vague patchy opacities
throughout the lungs concerning for pneumonia. No effusions. No
acute bony abnormality.
IMPRESSION: Interstitial prominence and vague bilateral opacities concerning for
COVID pneumonia.

## 2022-06-20 IMAGING — DX DG CHEST 1V PORT
1 series · 1 of 1 positions shown · non-contrast
Comparison: Chest radiograph dated 11/20/2020

CLINICAL DATA: COVID positive, hypoxia

EXAM:
PORTABLE CHEST 1 VIEW

[chest ap]
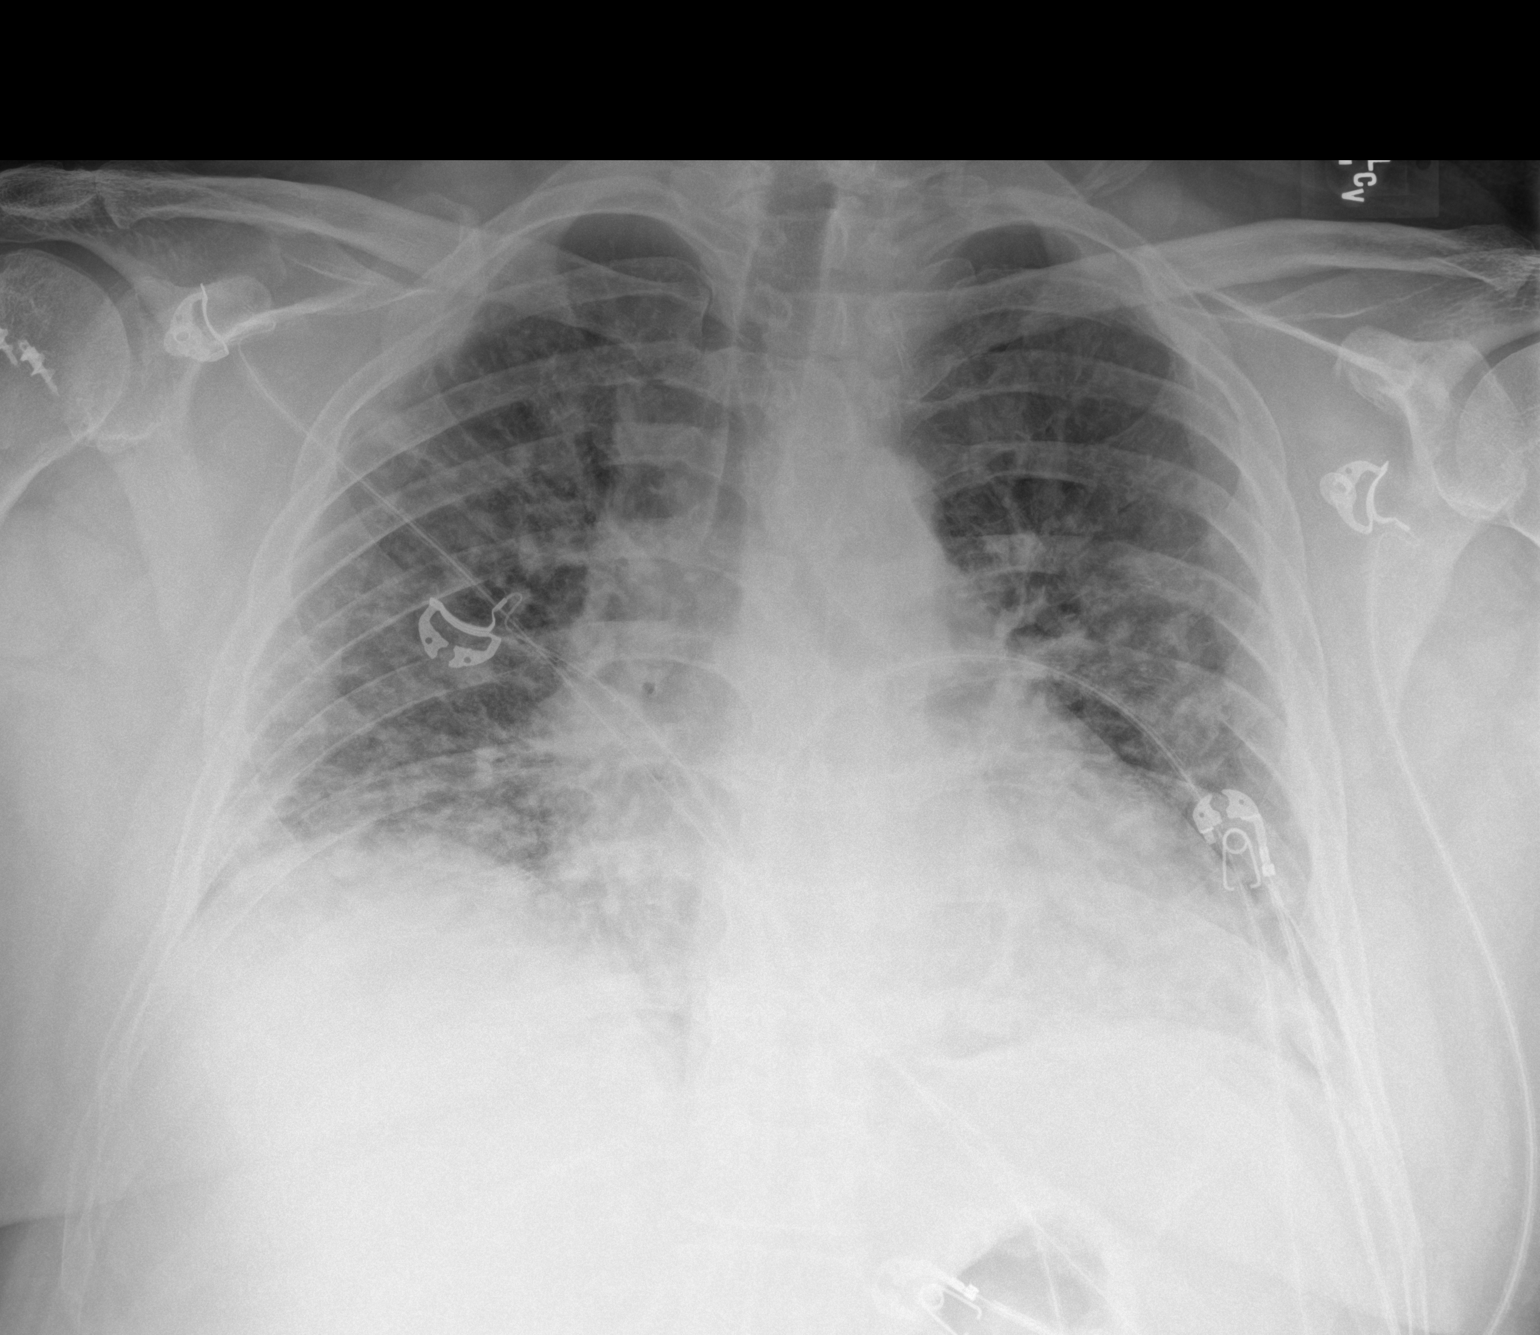

[1 of 1 positions shown; findings below may reference images not displayed]

FINDINGS: The heart remains enlarged. Moderate to severe diffuse bilateral
interstitial and airspace opacities appear increased since prior
exam. There is no pleural effusion or pneumothorax. The osseous
structures are intact.
IMPRESSION: Moderate to severe diffuse bilateral interstitial and airspace
opacities appear increased since prior exam, consistent with
[id number] pneumonia.
